# Patient Record
Sex: Female | Born: 1990
Health system: Southern US, Community
[De-identification: ages and names within clinical notes are randomized; demographics above are authoritative.]

## PROBLEM LIST (undated history)

## (undated) DIAGNOSIS — R87619 Unspecified abnormal cytological findings in specimens from cervix uteri: Secondary | ICD-10-CM

## (undated) DIAGNOSIS — F419 Anxiety disorder, unspecified: Secondary | ICD-10-CM

## (undated) DIAGNOSIS — L709 Acne, unspecified: Secondary | ICD-10-CM

## (undated) DIAGNOSIS — R011 Cardiac murmur, unspecified: Secondary | ICD-10-CM

## (undated) HISTORY — PX: WISDOM TOOTH EXTRACTION: SHX21

## (undated) HISTORY — DX: Unspecified abnormal cytological findings in specimens from cervix uteri: R87.619

## (undated) HISTORY — DX: Acne, unspecified: L70.9

---
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
## 2012-04-22 IMAGING — RF DG BARIUM SWALLOW
1 series · 15 of 17 positions shown · non-contrast
Comparison: none

REASON FOR EXAM: W TABLET  esphogeal spasm
COMMENTS:

[Series 1: run · 11 acquisitions, 15 frames shown]
[im 1/11]
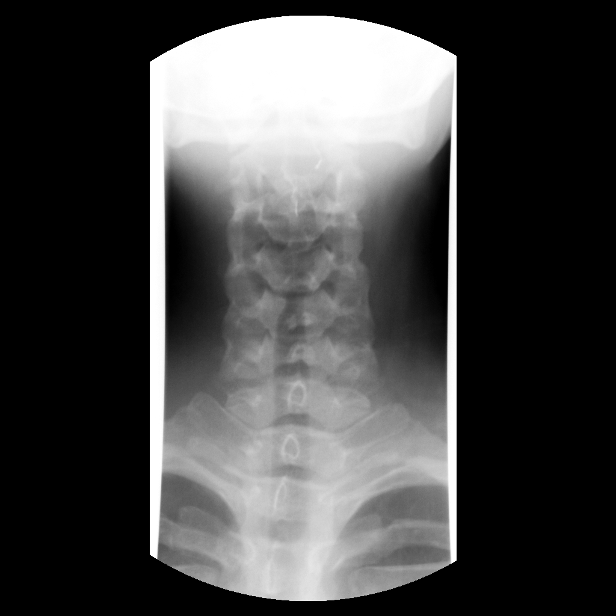
[im 1/11]
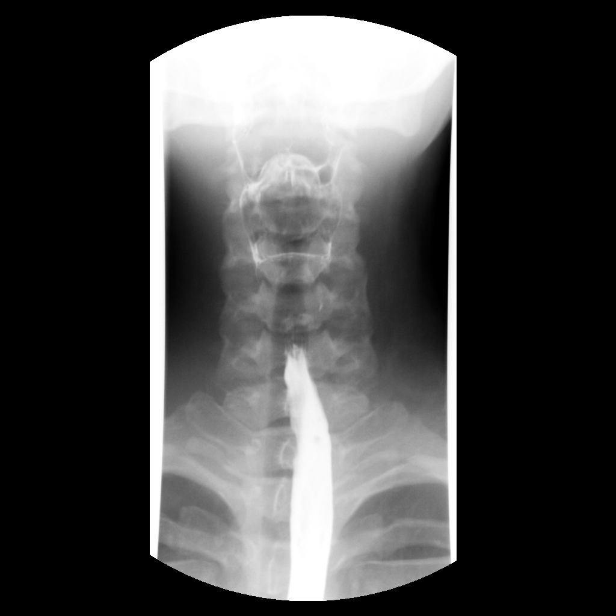
[im 1/11]
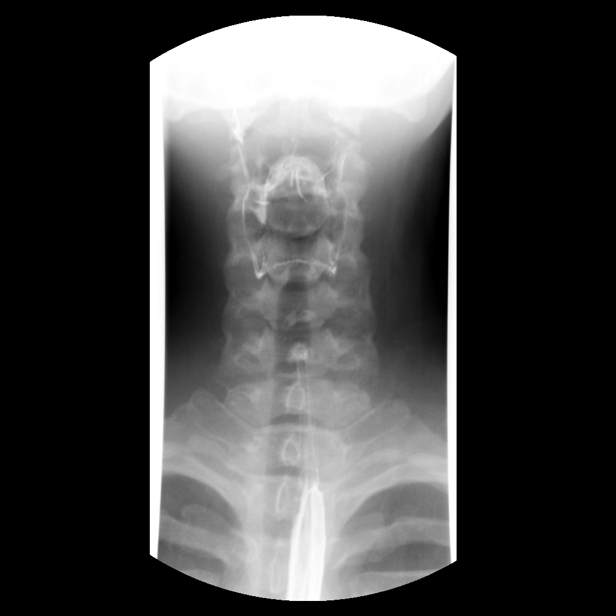
[im 1/11]
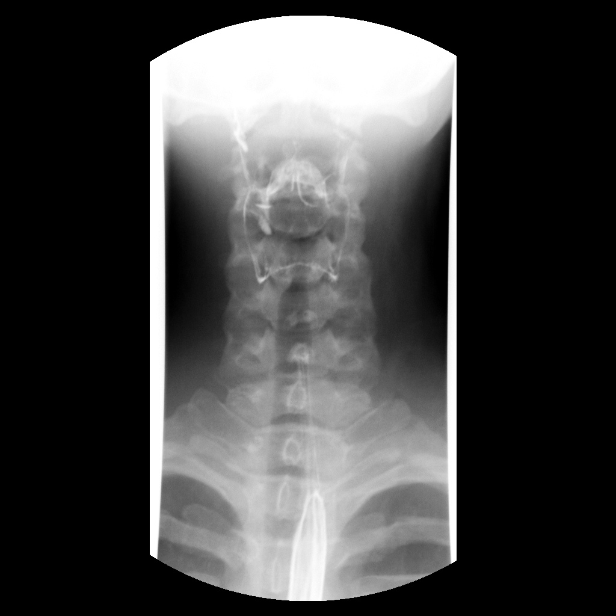
[im 2/11]
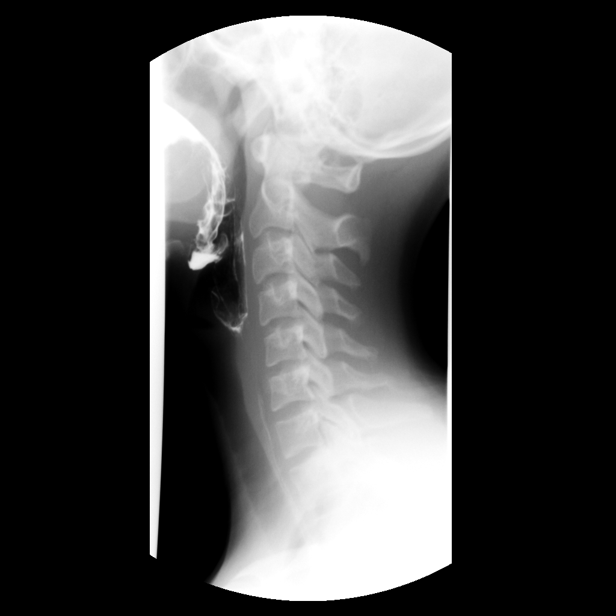
[im 2/11]
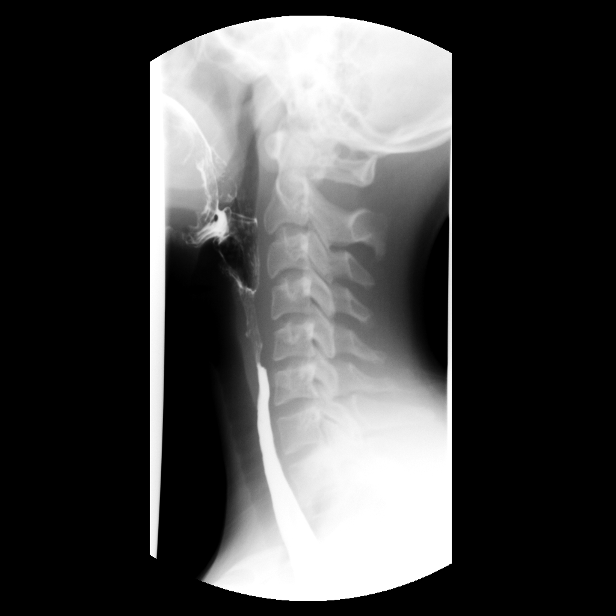
[im 2/11]
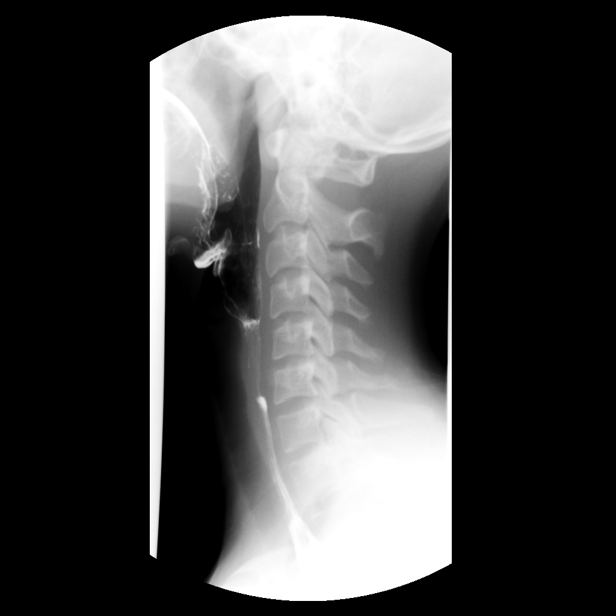
[im 3/11]
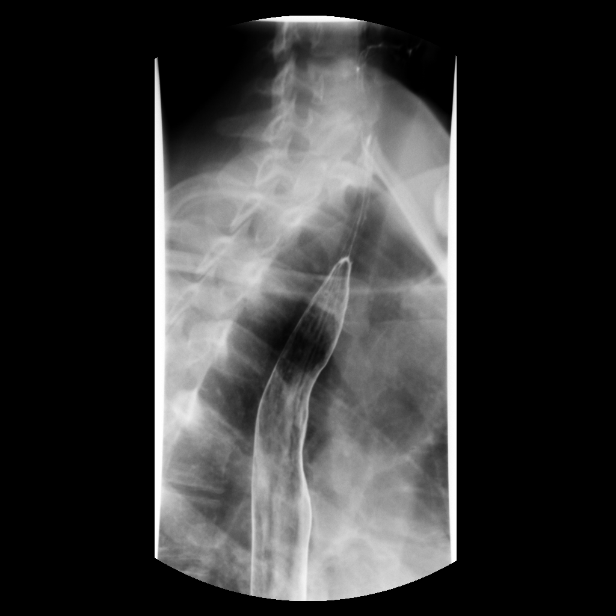
[im 4/11]
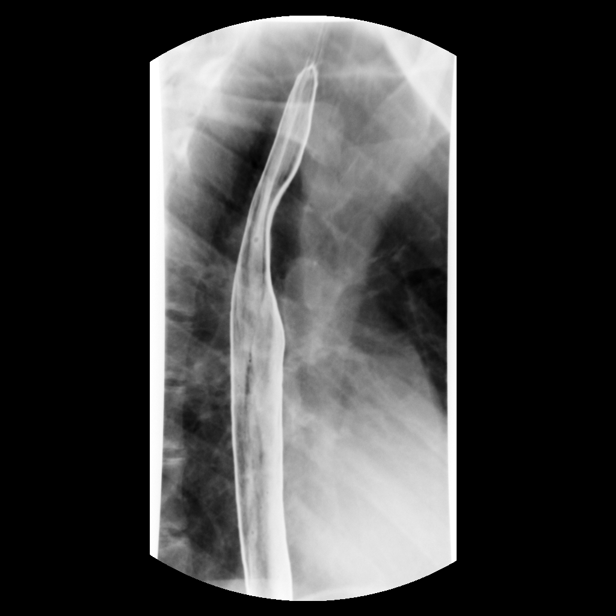
[im 5/11]
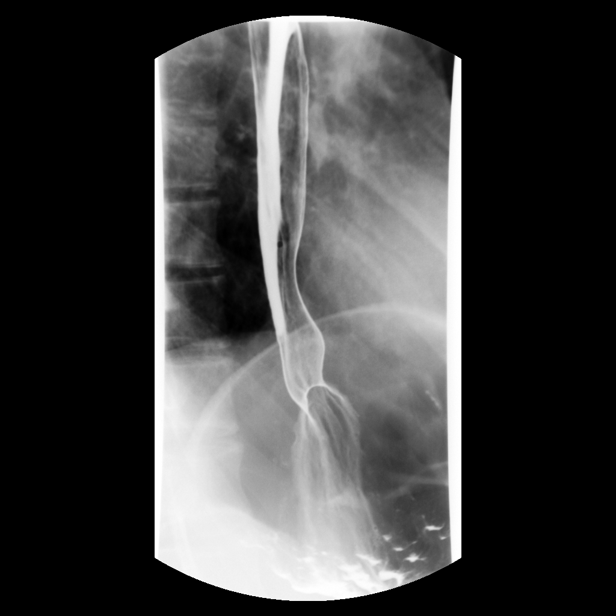
[im 6/11]
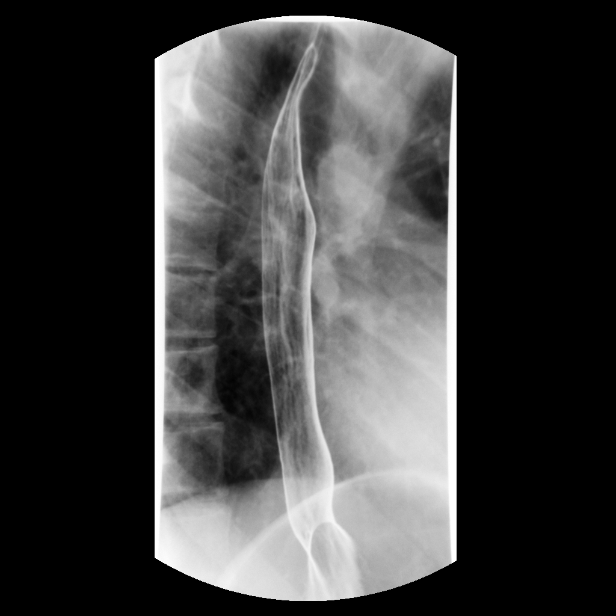
[im 8/11]
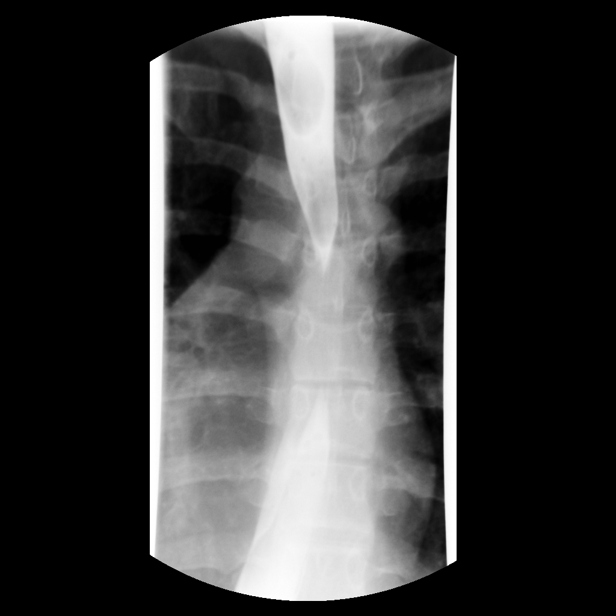
[im 9/11]
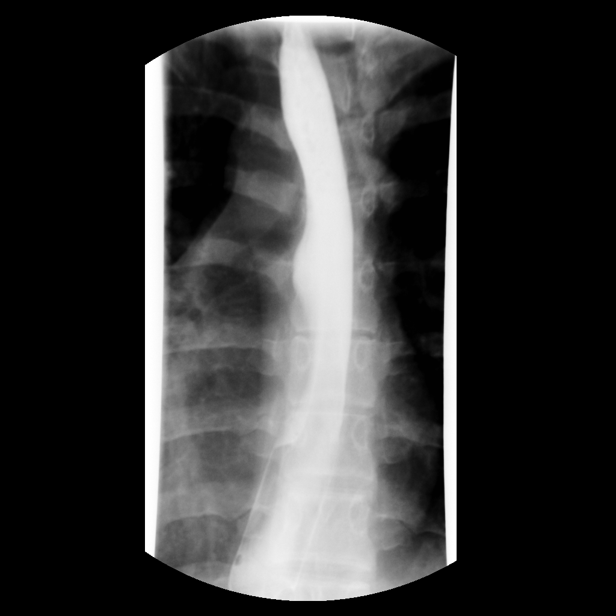
[im 10/11]
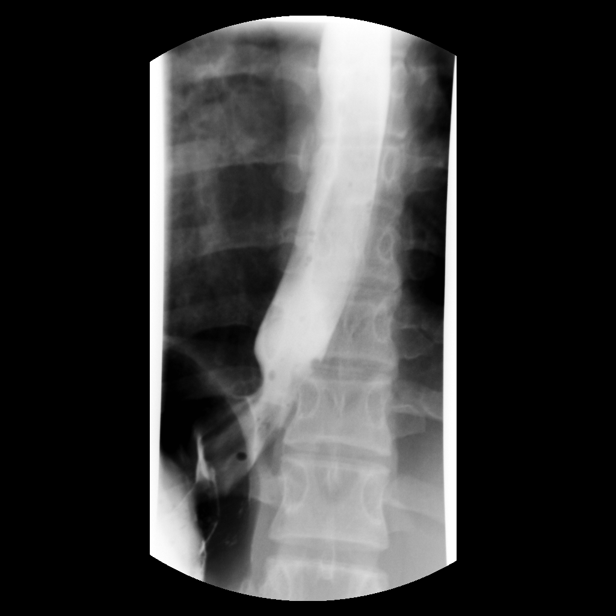
[im 11/11]
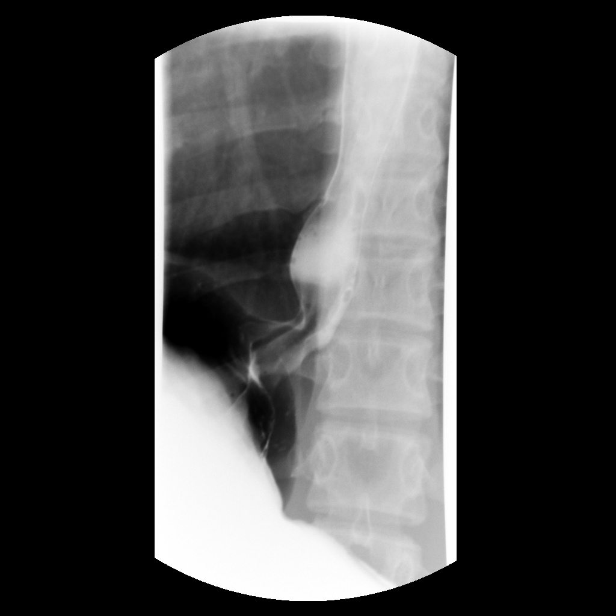

[15 of 17 positions shown; findings below may reference images not displayed]

PROCEDURE:     FL  - FL BARIUM SWALLOW  - May 14, 2010 [DATE]

RESULT:     Dynamic imaging of the cervical esophagus was performed during
swallowing and administration of oral barium. This was followed by
administration of effervescent crystals and evaluation of the remaining
components the esophagus.
FINDINGS: The cervical esophagus is unremarkable. The thoracic esophagus is
unremarkable. There is appropriate relaxation of the lower esophageal
sphincter. There is no evidence of gastroesophageal reflux, esophageal
dysmotility, or a hiatal hernia. A 12.5 mm Barotab was administered which
traversed the esophagus without complications.
IMPRESSION: Unremarkable barium swallow as described above.

## 2015-05-29 DIAGNOSIS — J329 Chronic sinusitis, unspecified: Secondary | ICD-10-CM | POA: Insufficient documentation

## 2015-12-18 ENCOUNTER — Encounter: Payer: Self-pay | Admitting: *Deleted

## 2015-12-20 ENCOUNTER — Ambulatory Visit: Payer: BLUE CROSS/BLUE SHIELD | Admitting: Anesthesiology

## 2015-12-20 ENCOUNTER — Encounter: Payer: Self-pay | Admitting: Otolaryngology

## 2015-12-20 ENCOUNTER — Ambulatory Visit
Admission: RE | Admit: 2015-12-20 | Discharge: 2015-12-20 | Disposition: A | Discharge status: Home / Self Care | Payer: BLUE CROSS/BLUE SHIELD | Source: Ambulatory Visit | Attending: Otolaryngology | Admitting: Otolaryngology

## 2015-12-20 ENCOUNTER — Encounter
Admission: RE | Disposition: A | Discharge status: Home / Self Care | Payer: Self-pay | Source: Ambulatory Visit | Attending: Otolaryngology

## 2015-12-20 DIAGNOSIS — Z809 Family history of malignant neoplasm, unspecified: Secondary | ICD-10-CM | POA: Diagnosis not present

## 2015-12-20 DIAGNOSIS — Z8489 Family history of other specified conditions: Secondary | ICD-10-CM | POA: Insufficient documentation

## 2015-12-20 DIAGNOSIS — Z833 Family history of diabetes mellitus: Secondary | ICD-10-CM | POA: Diagnosis not present

## 2015-12-20 DIAGNOSIS — Z87891 Personal history of nicotine dependence: Secondary | ICD-10-CM | POA: Insufficient documentation

## 2015-12-20 DIAGNOSIS — J342 Deviated nasal septum: Secondary | ICD-10-CM | POA: Diagnosis present

## 2015-12-20 DIAGNOSIS — Z79899 Other long term (current) drug therapy: Secondary | ICD-10-CM | POA: Insufficient documentation

## 2015-12-20 DIAGNOSIS — J329 Chronic sinusitis, unspecified: Secondary | ICD-10-CM | POA: Insufficient documentation

## 2015-12-20 HISTORY — PX: FRONTAL SINUS EXPLORATION: SHX6591

## 2015-12-20 HISTORY — DX: Cardiac murmur, unspecified: R01.1

## 2015-12-20 HISTORY — PX: MAXILLARY ANTROSTOMY: SHX2003

## 2015-12-20 HISTORY — PX: SEPTOPLASTY: SHX2393

## 2015-12-20 HISTORY — PX: ETHMOIDECTOMY: SHX5197

## 2015-12-20 HISTORY — DX: Anxiety disorder, unspecified: F41.9

## 2015-12-20 HISTORY — PX: IMAGE GUIDED SINUS SURGERY: SHX6570

## 2015-12-20 SURGERY — SINUS SURGERY, WITH IMAGING GUIDANCE
Anesthesia: General | Site: Nose | Wound class: Clean Contaminated

## 2015-12-20 MED ORDER — LIDOCAINE-EPINEPHRINE 1 %-1:100000 IJ SOLN
INTRAMUSCULAR | Status: DC | PRN
  Administered 2015-12-20: 7 mL

## 2015-12-20 MED ORDER — FENTANYL CITRATE (PF) 100 MCG/2ML IJ SOLN
INTRAMUSCULAR | Status: DC | PRN
  Administered 2015-12-20: 100 ug via INTRAVENOUS

## 2015-12-20 MED ORDER — ROCURONIUM BROMIDE 100 MG/10ML IV SOLN
INTRAVENOUS | Status: DC | PRN
Start: 2015-12-20 — End: 2015-12-20
  Administered 2015-12-20: 30 mg via INTRAVENOUS
  Administered 2015-12-20: 10 mg via INTRAVENOUS

## 2015-12-20 MED ORDER — SCOPOLAMINE 1 MG/3DAYS TD PT72
1.0000 | MEDICATED_PATCH | Freq: Once | TRANSDERMAL | Status: DC
  Administered 2015-12-20: 1.5 mg via TRANSDERMAL

## 2015-12-20 MED ORDER — ONDANSETRON HCL 4 MG/2ML IJ SOLN: 4.0000 mg | Freq: Once | INTRAMUSCULAR | Status: DC | PRN

## 2015-12-20 MED ORDER — ACETAMINOPHEN 10 MG/ML IV SOLN
1000.0000 mg | Freq: Once | INTRAVENOUS | Status: AC
  Administered 2015-12-20: 1000 mg via INTRAVENOUS

## 2015-12-20 MED ORDER — OXYMETAZOLINE HCL 0.05 % NA SOLN
2.0000 | Freq: Once | NASAL | Status: AC
  Administered 2015-12-20: 2 via NASAL

## 2015-12-20 MED ORDER — PHENYLEPHRINE HCL 0.5 % NA SOLN
NASAL | Status: DC | PRN
  Administered 2015-12-20: 30 mL via TOPICAL

## 2015-12-20 MED ORDER — ONDANSETRON HCL 4 MG/2ML IJ SOLN
INTRAMUSCULAR | Status: DC | PRN
  Administered 2015-12-20: 4 mg via INTRAVENOUS

## 2015-12-20 MED ORDER — OXYCODONE HCL 5 MG PO TABS
5.0000 mg | ORAL_TABLET | Freq: Once | ORAL | Status: AC
  Administered 2015-12-20: 5 mg via ORAL

## 2015-12-20 MED ORDER — LIDOCAINE HCL (CARDIAC) 20 MG/ML IV SOLN
INTRAVENOUS | Status: DC | PRN
  Administered 2015-12-20: 30 mg via INTRAVENOUS

## 2015-12-20 MED ORDER — GLYCOPYRROLATE 0.2 MG/ML IJ SOLN
INTRAMUSCULAR | Status: DC | PRN
  Administered 2015-12-20: 0.2 mg via INTRAVENOUS

## 2015-12-20 MED ORDER — FENTANYL CITRATE (PF) 100 MCG/2ML IJ SOLN
25.0000 ug | INTRAMUSCULAR | Status: DC | PRN
  Administered 2015-12-20: 50 ug via INTRAVENOUS

## 2015-12-20 MED ORDER — CEFAZOLIN SODIUM-DEXTROSE 2-4 GM/100ML-% IV SOLN
2.0000 g | Freq: Once | INTRAVENOUS | Status: AC
  Administered 2015-12-20: 2 g via INTRAVENOUS

## 2015-12-20 MED ORDER — LACTATED RINGERS IV SOLN
INTRAVENOUS | Status: DC
  Administered 2015-12-20: 12:00:00 via INTRAVENOUS

## 2015-12-20 MED ORDER — DEXAMETHASONE SODIUM PHOSPHATE 4 MG/ML IJ SOLN
INTRAMUSCULAR | Status: DC | PRN
  Administered 2015-12-20: 10 mg via INTRAVENOUS

## 2015-12-20 MED ORDER — PROPOFOL 10 MG/ML IV BOLUS
INTRAVENOUS | Status: DC | PRN
  Administered 2015-12-20: 150 mg via INTRAVENOUS

## 2015-12-20 MED ORDER — MIDAZOLAM HCL 5 MG/5ML IJ SOLN
INTRAMUSCULAR | Status: DC | PRN
Start: 2015-12-20 — End: 2015-12-20
  Administered 2015-12-20: 2 mg via INTRAVENOUS

## 2015-12-20 SURGICAL SUPPLY — 45 items
BALLOON SINUPLASTY SYSTEM (BALLOONS) IMPLANT
BATTERY INSTRU NAVIGATION (MISCELLANEOUS) ×12 IMPLANT
BLADE SURG 15 STRL LF DISP TIS (BLADE) IMPLANT
BLADE SURG 15 STRL SS (BLADE)
CANISTER SUCT 1200ML W/VALVE (MISCELLANEOUS) ×3 IMPLANT
CATH IV 18X1 1/4 SAFELET (CATHETERS) ×3 IMPLANT
COAG SUCT 10F 3.5MM HAND CTRL (MISCELLANEOUS) ×3 IMPLANT
COAGULATOR SUCT 8FR VV (MISCELLANEOUS) ×3 IMPLANT
DEVICE INFLATION SEID (MISCELLANEOUS) IMPLANT
DRAPE HEAD BAR (DRAPES) ×3 IMPLANT
DRESSING NASL FOAM PST OP SINU (MISCELLANEOUS) IMPLANT
DRSG NASAL FOAM POST OP SINU (MISCELLANEOUS)
GLOVE PI ULTRA LF STRL 7.5 (GLOVE) ×4 IMPLANT
GLOVE PI ULTRA NON LATEX 7.5 (GLOVE) ×2
IRRIGATOR 4MM STR (IRRIGATION / IRRIGATOR) IMPLANT
IV CATH 18X1 1/4 SAFELET (CATHETERS) ×2
IV NS 500ML (IV SOLUTION) ×1
IV NS 500ML BAXH (IV SOLUTION) ×2 IMPLANT
KIT ROOM TURNOVER OR (KITS) ×3 IMPLANT
NAVIGATION MASK REG  ST (MISCELLANEOUS) ×3 IMPLANT
NEEDLE HYPO 25GX1X1/2 BEV (NEEDLE) ×3 IMPLANT
NEEDLE SPNL 25GX3.5 QUINCKE BL (NEEDLE) ×3 IMPLANT
NS IRRIG 500ML POUR BTL (IV SOLUTION) ×3 IMPLANT
PACK DRAPE NASAL/ENT (PACKS) ×3 IMPLANT
PACKING NASAL EPIS 4X2.4 XEROG (MISCELLANEOUS) ×6 IMPLANT
PAD GROUND ADULT SPLIT (MISCELLANEOUS) ×3 IMPLANT
PATTIES SURGICAL .5 X3 (DISPOSABLE) ×3 IMPLANT
SET HANDPIECE IRR DIEGO (MISCELLANEOUS) IMPLANT
SHAVER DIEGO BLD STD TYPE A (BLADE) ×3 IMPLANT
SOL ANTI-FOG 6CC FOG-OUT (MISCELLANEOUS) ×2 IMPLANT
SOL FOG-OUT ANTI-FOG 6CC (MISCELLANEOUS) ×1
SPLINT NASAL SEPTAL BLV .50 ST (MISCELLANEOUS) ×3 IMPLANT
STRAP BODY AND KNEE 60X3 (MISCELLANEOUS) ×3 IMPLANT
SUT CHROMIC 3-0 (SUTURE) ×1
SUT CHROMIC 3-0 KS 27XMFL CR (SUTURE) ×2
SUT ETHILON 3-0 KS 30 BLK (SUTURE) ×3 IMPLANT
SUT ETHILON 4-0 (SUTURE)
SUT ETHILON 4-0 FS2 18XMFL BLK (SUTURE)
SUT PLAIN GUT 4-0 (SUTURE) ×3 IMPLANT
SUTURE CHRMC 3-0 KS 27XMFL CR (SUTURE) ×2 IMPLANT
SUTURE ETHLN 4-0 FS2 18XMF BLK (SUTURE) IMPLANT
SYR 3ML LL SCALE MARK (SYRINGE) ×3 IMPLANT
TOWEL OR 17X26 4PK STRL BLUE (TOWEL DISPOSABLE) ×3 IMPLANT
TUBING DECLOG (TUBING) ×6 IMPLANT
WATER STERILE IRR 500ML POUR (IV SOLUTION) ×3 IMPLANT

## 2015-12-20 NOTE — Transfer of Care (Signed)
Immediate Anesthesia Transfer of Care Note  Patient: Danielle Schwartz  Procedure(s) Performed: Procedure(s) with comments: IMAGE GUIDED SINUS SURGERY (N/A) - GAVE DISK TO CECE 6/28 DEE SEPTOPLASTY (N/A) ENDOSCOPIC MAXILLARY ANTROSTOMY BILATERAL (Bilateral)  BILATERAL (Bilateral) ENDOSCPIC ETHMOIDECTOMY TOTAL BILATERAL (Bilateral)  Patient Location: PACU  Anesthesia Type: General ETT  Level of Consciousness: awake, alert  and patient cooperative  Airway and Oxygen Therapy: Patient Spontanous Breathing and Patient connected to supplemental oxygen  Post-op Assessment: Post-op Vital signs reviewed, Patient's Cardiovascular Status Stable, Respiratory Function Stable, Patent Airway and No signs of Nausea or vomiting  Post-op Vital Signs: Reviewed and stable  Complications: No apparent anesthesia complications

## 2015-12-20 NOTE — Anesthesia Procedure Notes (Signed)
Procedure Name: Intubation Date/Time: 12/20/2015 1:04 PM Performed by: Andee PolesBUSH, Corley Kohls Pre-anesthesia Checklist: Patient identified, Emergency Drugs available, Suction available, Patient being monitored and Timeout performed Patient Re-evaluated:Patient Re-evaluated prior to inductionOxygen Delivery Method: Circle system utilized Preoxygenation: Pre-oxygenation with 100% oxygen Intubation Type: IV induction Ventilation: Mask ventilation without difficulty Laryngoscope Size: Mac and 3 Grade View: Grade I Tube type: Oral Rae Tube size: 7.0 mm Number of attempts: 1 Placement Confirmation: ETT inserted through vocal cords under direct vision,  positive ETCO2 and breath sounds checked- equal and bilateral Tube secured with: Tape Dental Injury: Teeth and Oropharynx as per pre-operative assessment

## 2015-12-20 NOTE — Discharge Instructions (Signed)
[facility] °[facility] °ENDOSCOPIC SINUS SURGERY °[facility] ° °What is Functional Endoscopic Sinus Surgery? ° The Surgery involves making the natural openings of the sinuses larger by removing the bony partitions that separate the sinuses from the nasal cavity.  The natural sinus lining is preserved as much as possible to allow the sinuses to resume normal function after the surgery.  In some patients nasal polyps (excessively swollen lining of the sinuses) may be removed to relieve obstruction of the sinus openings.  The surgery is performed through the nose using lighted scopes, which eliminates the need for incisions on the face.  A septoplasty is a different procedure which is sometimes performed with sinus surgery.  It involves straightening the boy partition that separates the two sides of your nose.  A crooked or deviated septum may need repair if is obstructing the sinuses or nasal airflow.  Turbinate reduction is also often performed during sinus surgery.  The turbinates are bony proturberances from the side walls of the nose which swell and can obstruct the nose in patients with sinus and allergy problems.  Their size can be surgically reduced to help relieve nasal obstruction. ° °What Can Sinus Surgery Do For Me? ° Sinus surgery can reduce the frequency of sinus infections requiring antibiotic treatment.  This can provide improvement in nasal congestion, post-nasal drainage, facial pressure and nasal obstruction.  Surgery will NOT prevent you from ever having an infection again, so it usually only for patients who get infections 4 or more times yearly requiring antibiotics, or for infections that do not clear with antibiotics.  It will not cure nasal allergies, so patients with allergies may still require medication to treat their allergies after surgery. Surgery may improve headaches related to sinusitis, however, some people will continue to  require medication to control sinus headaches related to allergies.  Surgery will do nothing for other forms of headache (migraine, tension or cluster). ° °What Are the Risks of Endoscopic Sinus Surgery? ° Current techniques allow surgery to be performed safely with little risk, however, there are rare complications that patients should be aware of.  Because the sinuses are located around the eyes, there is risk of eye injury, including blindness, though again, this would be quite rare. This is usually a result of bleeding behind the eye during surgery, which puts the vision oat risk, though there are treatments to protect the vision and prevent permanent disrupted by surgery causing a leak of the spinal fluid that surrounds the brain.  More serious complications would include bleeding inside the brain cavity or damage to the brain.  Again, all of these complications are uncommon, and spinal fluid leaks can be safely managed surgically if they occur.  The most common complication of sinus surgery is bleeding from the nose, which may require packing or cauterization of the nose.  Continued sinus have polyps may experience recurrence of the polyps requiring revision surgery.  Alterations of sense of smell or injury to the tear ducts are also rare complications.  ° °What is the Surgery Like, and what is the Recovery? ° The Surgery usually takes a couple of hours to perform, and is usually performed under a general anesthetic (completely asleep).  Patients are usually discharged home after a couple of hours.  Sometimes during surgery it is necessary to pack the nose to control bleeding, and the packing is left in place for 24 - 48 hours, and removed by your surgeon.    If a septoplasty was performed during the procedure, there is often a splint placed which must be removed after 5-7 days.   °Discomfort: Pain is usually mild to moderate, and can be controlled by prescription pain medication or acetaminophen (Tylenol).   Aspirin, Ibuprofen (Advil, Motrin), or Naprosyn (Aleve) should be avoided, as they can cause increased bleeding.  Most patients feel sinus pressure like they have a bad head cold for several days.  Sleeping with your head elevated can help reduce swelling and facial pressure, as can ice packs over the face.  A humidifier may be helpful to keep the mucous and blood from drying in the nose.  ° °Diet: There are no specific diet restrictions, however, you should generally start with clear liquids and a light diet of bland foods because the anesthetic can cause some nausea.  Advance your diet depending on how your stomach feels.  Taking your pain medication with food will often help reduce stomach upset which pain medications can cause. ° °Nasal Saline Irrigation: It is important to remove blood clots and dried mucous from the nose as it is healing.  This is done by having you irrigate the nose at least 3 - 4 times daily with a salt water solution.  We recommend using NeilMed Sinus Rinse (available at the drug store).  Fill the squeeze bottle with the solution, bend over a sink, and insert the tip of the squeeze bottle into the nose ½ of an inch.  Point the tip of the squeeze bottle towards the inside corner of the eye on the same side your irrigating.  Squeeze the bottle and gently irrigate the nose.  If you bend forward as you do this, most of the fluid will flow back out of the nose, instead of down your throat.   The solution should be warm, near body temperature, when you irrigate.   Each time you irrigate, you should use a full squeeze bottle.  ° °Note that if you are instructed to use Nasal Steroid Sprays at any time after your surgery, irrigate with saline BEFORE using the steroid spray, so you do not wash it all out of the nose. °Another product, Nasal Saline Gel (such as AYR Nasal Saline Gel) can be applied in each nostril 3 - 4 times daily to moisture the nose and reduce scabbing or crusting. ° °Bleeding:   Bloody drainage from the nose can be expected for several days, and patients are instructed to irrigate their nose frequently with salt water to help remove mucous and blood clots.  The drainage may be dark red or brown, though some fresh blood may be seen intermittently, especially after irrigation.  Do not blow you nose, as bleeding may occur. If you must sneeze, keep your mouth open to allow air to escape through your mouth. ° °If heavy bleeding occurs: Irrigate the nose with saline to rinse out clots, then spray the nose 3 - 4 times with Afrin Nasal Decongestant Spray.  The spray will constrict the blood vessels to slow bleeding.  Pinch the lower half of your nose shut to apply pressure, and lay down with your head elevated.  Ice packs over the nose may help as well. If bleeding persists despite these measures, you should notify your doctor.  Do not use the Afrin routinely to control nasal congestion after surgery, as it can result in worsening congestion and may affect healing.  ° ° ° °Activity: Return to work varies among patients. Most patients will be   out of work at least 5 - 7 days to recover.  Patient may return to work after they are off of narcotic pain medication, and feeling well enough to perform the functions of their job.  Patients must avoid heavy lifting (over 10 pounds) or strenuous physical for 2 weeks after surgery, so your employer may need to assign you to light duty, or keep you out of work longer if light duty is not possible.  NOTE: you should not drive, operate dangerous machinery, do any mentally demanding tasks or make any important legal or financial decisions while on narcotic pain medication and recovering from the general anesthetic.    Call Your Doctor Immediately if You Have Any of the Following: 1. Bleeding that you cannot control with the above measures 2. Loss of vision, double vision, bulging of the eye or black eyes. 3. Fever over 101 degrees 4. Neck stiffness with  severe headache, fever, nausea and change in mental state. You are always encourage to call anytime with concerns, however, please call with requests for pain medication refills during office hours.  Office Endoscopy: During follow-up visits your doctor will remove any packing or splints that may have been placed and evaluate and clean your sinuses endoscopically.  Topical anesthetic will be used to make this as comfortable as possible, though you may want to take your pain medication prior to the visit.  How often this will need to be done varies from patient to patient.  After complete recovery from the surgery, you may need follow-up endoscopy from time to time, particularly if there is concern of recurrent infection or nasal polyps.    General Anesthesia, Adult, Care After Refer to this sheet in the next few weeks. These instructions provide you with information on caring for yourself after your procedure. Your health care provider may also give you more specific instructions. Your treatment has been planned according to current medical practices, but problems sometimes occur. Call your health care provider if you have any problems or questions after your procedure. WHAT TO EXPECT AFTER THE PROCEDURE After the procedure, it is typical to experience:  Sleepiness.  Nausea and vomiting. HOME CARE INSTRUCTIONS  For the first 24 hours after general anesthesia:  Have a responsible person with you.  Do not drive a car. If you are alone, do not take public transportation.  Do not drink alcohol.  Do not take medicine that has not been prescribed by your health care provider.  Do not sign important papers or make important decisions.  You may resume a normal diet and activities as directed by your health care provider.  Change bandages (dressings) as directed.  If you have questions or problems that seem related to general anesthesia, call the hospital and ask for the anesthetist or  anesthesiologist on call. SEEK MEDICAL CARE IF:  You have nausea and vomiting that continue the day after anesthesia.  You develop a rash. SEEK IMMEDIATE MEDICAL CARE IF:   You have difficulty breathing.  You have chest pain.  You have any allergic problems.   This information is not intended to replace advice given to you by your health care provider. Make sure you discuss any questions you have with your health care provider.   Document Released: 09/15/2000 Document Revised: 06/30/2014 Document Reviewed: 10/08/2011 Elsevier Interactive Patient Education 2016 [employer].  Scopolamine skin patches REMOVE PATCH IN 72 HOURS AND WASH HANDS What is this medicine? SCOPOLAMINE (skoe POL a meen) is used to prevent nausea  and vomiting caused by motion sickness, anesthesia and surgery. This medicine may be used for other purposes; ask your health care provider or pharmacist if you have questions. What should I tell my health care provider before I take this medicine? They need to know if you have any of these conditions: -glaucoma -kidney or liver disease -an unusual or allergic reaction (especially skin allergy) to scopolamine, atropine, other medicines, foods, dyes, or preservatives -pregnant or trying to get pregnant -breast-feeding How should I use this medicine? This medicine is for external use only. Follow the directions on the prescription label. One patch contains enough medicine to prevent motion sickness for up to 3 days. Apply the patch at least 4 hours before you need it and only wear one disc at a time. Choose an area behind the ear, that is clean, dry, hairless and free from any cuts or irritation. Wipe the area with a clean dry tissue. Peel off the plastic backing of the skin patch, trying not to touch the adhesive side with your hands. Do not cut the patches. Firmly apply to the area you have chosen, with the metallic side of the patch to the skin and the tan-colored side  showing. Once firmly in place, wash your hands well with soap and water. Remove the disc after 3 days, or sooner if you no longer need it. After removing the patch, wash your hands and the area behind your ear thoroughly with soap and water. The patch will still contain some medicine after use. To avoid accidental contact or ingestion by children or pets, fold the used patch in half with the sticky side together and throw away in the trash out of the reach of children and pets. If you need to use a second patch after you remove the first, place it behind the other ear. Talk to your pediatrician regarding the use of this medicine in children. Special care may be needed. Overdosage: If you think you have taken too much of this medicine contact a poison control center or emergency room at once. NOTE: This medicine is only for you. Do not share this medicine with others. What if I miss a dose? Make sure you apply the patch at least 4 hours before you need it. You can apply it the night before traveling. What may interact with this medicine? -benztropine -bethanechol -medicines for anxiety or sleeping problems like diazepam or temazepam -medicines for hay fever and other allergies -medicines for mental depression -muscle relaxants This list may not describe all possible interactions. Give your health care provider a list of all the medicines, herbs, non-prescription drugs, or dietary supplements you use. Also tell them if you smoke, drink alcohol, or use illegal drugs. Some items may interact with your medicine. What should I watch for while using this medicine? Keep the patch dry, if possible, to prevent it from falling off. Limited contact with water, however, as in bathing or swimming, will not affect the system. If the patch falls off, throw it away and put a new one behind the other ear. You may get drowsy or dizzy. Do not drive, use machinery, or do anything that needs mental alertness until you  know how this medicine affects you. Do not stand or sit up quickly, especially if you are an older patient. This reduces the risk of dizzy or fainting spells. Alcohol may interfere with the effect of this medicine. Avoid alcoholic drinks. Your mouth may get dry. Chewing sugarless gum or sucking hard candy,  and drinking plenty of water may help. Contact your doctor if the problem does not go away or is severe. This medicine may cause dry eyes and blurred vision. If you wear contact lenses you may feel some discomfort. Lubricating drops may help. See your eye doctor if the problem does not go away or is severe. If you are going to have a magnetic resonance imaging (MRI) procedure, tell your MRI technician if you have this patch on your body. It must be removed before a MRI. What side effects may I notice from receiving this medicine? Side effects that you should report to your doctor or health care professional as soon as possible: -agitation, nervousness, confusion -blurred vision and other eye problems -dizziness, drowsiness -eye pain or redness in the whites of the eye -hallucinations -pain or difficulty passing urine -skin rash, itching -vomiting Side effects that usually do not require medical attention (report to your doctor or health care professional if they continue or are bothersome): -headache -nausea This list may not describe all possible side effects. Call your doctor for medical advice about side effects. You may report side effects to FDA at 1-800-FDA-1088. Where should I keep my medicine? Keep out of the reach of children. Store at room temperature between 20 and 25 degrees C (68 and 77 degrees F). Throw away any unused medicine after the expiration date. When you remove a patch, fold it and throw it in the trash as described above. NOTE: This sheet is a summary. It may not cover all possible information. If you have questions about this medicine, talk to your doctor, pharmacist,  or health care provider.    2016, Elsevier/Gold Standard. (2011-11-06 13:31:48)

## 2015-12-20 NOTE — Op Note (Signed)
12/20/2015  3:37 PM    Melvia HeapsSmith, Momina  [id number]   Pre-Op Dx:  Septal deviation, chronic bilateral frontal sinusitis, chronic bilateral maxillary sinusitis, chronic bilateral ethmoid sinusitis  Post-op Dx: Same  Proc: Septoplasty, bilateral endoscopic total ethmoidectomy, bilateral endoscopic maxillary antrostomies, bilateral endoscopic frontal sinusotomies, use of image guided system, outfracture of right inferior turbinate   Surg:  Shaquna Geigle H  Anes:  GOT  EBL:  100 mL  Comp:  None  Findings:  Thickened mucous membranes at the openings maxillary sinuses and frontal sinuses. There was clear thick mucus coming from the left maxillary sinus. The ethmoid plate was deviated to the right and there is a bony spur to the left posteriorly  Procedure: The patient was given general anesthesia by oral endotracheal intubation. The nose prepped using 6 mL 1% Xylocaine with epi 100,000 for infiltration nasal septum and lateral nasal walls. Cottonoid pledgets were placed in the airway on both sides soaked with phenylephrine and Xylocaine. The image guided system was brought in and the CT scan was downloaded to the disc. The template was applied the face in the template was registered. Recheck 0.5 mm of variance. The suction instruments were then registered and there was perfect alignment between the suction instruments and the sinuses on the CT scan. She was prepped and draped sterile fashion.  The cotton pledges were removed and the 0 scope was used to visualize the nose. See the septum had an S-shaped deformity with the ethmoid plate to the right and the vomer to the left. A left Killian incision was created with elevation of mucoperichondrium the left side of the quadrangular plate. The bony cartilaginous junction was split and the mucoperiosteum  was elevated on both sides of the vomer and ethmoid plate. This was fractured remove to get the large spur out of the vomer on the left side and to  remove some of the ethmoid plate that was buckled to the right. Remaining anterior cartilaginous plate was freed up inferiorly. A 2-3 mm bridge of cartilage was removed inferiorly to allow it to swing back to the midline. The mucosal flaps were placed back in their anatomic position and this straightened out septum with a good airway on both sides. 40 plain gut suture on a mini Keith needle was used for a through and through suture to hold the mucosal flaps to the septum and to close the [place] incision.  The 0 scope was used to visualize the left nasal passage. The middle turbinate was infractured. 1 mL of local anesthesia was placed in the uncinate process and in the middle turbinate for vasoconstriction. A side biter was used to incise the uncinate process and then this was removed with 45 through biting forceps and the microdebrider. The natural ostium was visualized underneath this and was widened. There is some thick clear mucus that was coming from this sinus. Natural ostium was opened posteriorly to widen the maxillary antrum. Re-scopes were used to visualize the sinus make sure was clear. The 0 scope was then used for opening up the posterior ethmoid air cells using the microdebrider. The image guided system was used to visualize the depth of dissection make sure all the ethmoid air cells were opened. The middle ethmoid air cells were opened as well 0 scope and then the 30 scope was used for opening the anterior ethmoid air cells. I can see the opening into the frontal sinus duct and this was followed using the microdebrider and the frontal sinus through biting  instruments to widen the opening that he could see a 6 mm opening into the frontal sinus. The frontal sinus image guided suction was used to visualize this area and verify the sinus was well opened. A cottonoid pledget soaked in phenylephrine and Xylocaine was placed here temporarily for vasoconstriction.  The right side was visualized with  the 0 scope and the middle turbinate was infractured. The septum was straighter now and this crated a larger middle meatus. Junction was made in the uncinate process and middle turbinate again with local anesthesia for vasoconstriction. The uncinate process was incised and removed using the side biters, 45 through biting forceps, and the microdebrider. The maxillary antrum was widened and opened posteriorly. A good opening into the maxillary sinus. 30 scopes were used to visualize the area make sure it was cleaned and the natural ostium was included in this. The 0 scope was used to open up the posterior ethmoid air cells and the image guided system was used to make sure all the air cells were opened. The middle ethmoid air cells were then opened as well make sure there all cleaned and open. The 30 scope was then used to visualize the anterior ethmoid air cells and these were opened using the microdebrider and 45 forceps. The frontal sinus duct was evident had a lot of thick bone around it. The frontal sinus through biting forceps were used to widen the opening and created about a 6 mm hole opened into the frontal sinus duct. The frontal sinus image guided suction was used to verify were in the frontal sinus this area and it was completely clear. A cottonoid pledget was placed in the side once all the sinuses were opened.  The left side was visualized again and all the sinuses were clear. There is minimal ooze of any blood. This was suctioned clear and xerogel was then placed in the anterior ethmoid and frontal sinus duct and more in the posterior ethmoid as well. The right side was revisualized again and the 0 and 30 scopes and also showed to help all the sinuses cleared minimal ooze. Xerogel was placed in the anterior ethmoid again and then in the posterior ethmoid. The right inferior turbinate was outfractured in a little more room the right nasal airway where the left inferior turbinate was already  lateralized some cousins of the previous septal spur side. Xomed 0.5 mm splints were then trimmed and placed on both sides nasal septum and held in position with a 3-0 nylon through and through suture.  The patient tolerated the procedure well. She was awakened taken to the recovery room in satisfactory condition. There were no operative complications.  Dispo:   To PACU to be discharged home  Plan:  To follow-up in the office in 6 days for splint removal. She will rest at home with her head elevated. We will arts saline flushes tomorrow. She is on Augmentin and will use Norco 5/325 for pain as needed. She was started prednisone taper from 30 mg 0 over the next 6 days. They'll call if she has any problems over the weekend.  Tamanna Whitson H  12/20/2015 3:37 PM

## 2015-12-20 NOTE — Anesthesia Preprocedure Evaluation (Signed)
Anesthesia Evaluation  Patient identified by MRN, date of birth, ID band  Reviewed: Allergy & Precautions, H&P , NPO status , Patient's Chart, lab work & pertinent test results  Airway Mallampati: II  TM Distance: >3 FB Neck ROM: full    Dental no notable dental hx.    Pulmonary former smoker,    Pulmonary exam normal        Cardiovascular  Rhythm:regular Rate:Normal     Neuro/Psych    GI/Hepatic   Endo/Other    Renal/GU      Musculoskeletal   Abdominal   Peds  Hematology   Anesthesia Other Findings   Reproductive/Obstetrics                             Anesthesia Physical Anesthesia Plan  ASA: I  Anesthesia Plan: General ETT   Post-op Pain Management:    Induction: Intravenous  Airway Management Planned: Oral ETT  Additional Equipment:   Intra-op Plan:   Post-operative Plan:   Informed Consent: I have reviewed the patients History and Physical, chart, labs and discussed the procedure including the risks, benefits and alternatives for the proposed anesthesia with the patient or authorized representative who has indicated his/her understanding and acceptance.     Plan Discussed with: CRNA  Anesthesia Plan Comments:         Anesthesia Quick Evaluation  

## 2015-12-20 NOTE — Anesthesia Postprocedure Evaluation (Signed)
Anesthesia Post Note  Patient: Danielle Schwartz  Procedure(s) Performed: Procedure(s) (LRB): IMAGE GUIDED SINUS SURGERY (N/A) SEPTOPLASTY (N/A) ENDOSCOPIC MAXILLARY ANTROSTOMY BILATERAL (Bilateral)  BILATERAL (Bilateral) ENDOSCPIC ETHMOIDECTOMY TOTAL BILATERAL (Bilateral)  Patient location during evaluation: PACU Anesthesia Type: General Level of consciousness: awake and alert and oriented Pain management: satisfactory to patient Vital Signs Assessment: post-procedure vital signs reviewed and stable Respiratory status: spontaneous breathing, nonlabored ventilation and respiratory function stable Cardiovascular status: blood pressure returned to baseline and stable Postop Assessment: Adequate PO intake and No signs of nausea or vomiting Anesthetic complications: no    Cherly BeachStella, Galo Sayed J

## 2015-12-20 NOTE — H&P (Signed)
  H&P has been reviewed and no changes necessary. To be downloaded later. 

## 2015-12-21 ENCOUNTER — Encounter: Payer: Self-pay | Admitting: Otolaryngology

## 2015-12-24 LAB — SURGICAL PATHOLOGY

## 2016-06-23 HISTORY — PX: UPPER GI ENDOSCOPY: SHX6162

## 2016-06-23 HISTORY — PX: COLONOSCOPY: SHX174

## 2016-09-04 ENCOUNTER — Ambulatory Visit (INDEPENDENT_AMBULATORY_CARE_PROVIDER_SITE_OTHER): Payer: 59 | Admitting: Certified Nurse Midwife

## 2016-09-04 ENCOUNTER — Other Ambulatory Visit: Payer: Self-pay

## 2016-09-04 ENCOUNTER — Encounter: Payer: Self-pay | Admitting: Certified Nurse Midwife

## 2016-09-04 ENCOUNTER — Encounter: Payer: Self-pay | Admitting: Obstetrics and Gynecology

## 2016-09-04 VITALS — BP 116/78 | HR 67 | Ht 69.5 in | Wt 155.1 lb

## 2016-09-04 DIAGNOSIS — N390 Urinary tract infection, site not specified: Secondary | ICD-10-CM | POA: Diagnosis not present

## 2016-09-04 DIAGNOSIS — Z113 Encounter for screening for infections with a predominantly sexual mode of transmission: Secondary | ICD-10-CM

## 2016-09-04 DIAGNOSIS — Z01419 Encounter for gynecological examination (general) (routine) without abnormal findings: Secondary | ICD-10-CM

## 2016-09-04 LAB — POCT URINALYSIS DIPSTICK
BILIRUBIN UA: NEGATIVE
GLUCOSE UA: NEGATIVE
Ketones, UA: NEGATIVE
Leukocytes, UA: NEGATIVE
Nitrite, UA: NEGATIVE
Protein, UA: NEGATIVE
RBC UA: NEGATIVE
SPEC GRAV UA: 1.025
Urobilinogen, UA: 0.2
pH, UA: 6

## 2016-09-04 NOTE — Patient Instructions (Addendum)
Preventive Care 18-39 Years, Female Preventive care refers to lifestyle choices and visits with your health care provider that can promote health and wellness. What does preventive care include?  A yearly physical exam. This is also called an annual well check.  Dental exams once or twice a year.  Routine eye exams. Ask your health care provider how often you should have your eyes checked.  Personal lifestyle choices, including:  Daily care of your teeth and gums.  Regular physical activity.  Eating a healthy diet.  Avoiding tobacco and drug use.  Limiting alcohol use.  Practicing safe sex.  Taking vitamin and mineral supplements as recommended by your health care provider. What happens during an annual well check? The services and screenings done by your health care provider during your annual well check will depend on your age, overall health, lifestyle risk factors, and family history of disease. Counseling  Your health care provider may ask you questions about your:  Alcohol use.  Tobacco use.  Drug use.  Emotional well-being.  Home and relationship well-being.  Sexual activity.  Eating habits.  Work and work environment.  Method of birth control.  Menstrual cycle.  Pregnancy history. Screening  You may have the following tests or measurements:  Height, weight, and BMI.  Diabetes screening. This is done by checking your blood sugar (glucose) after you have not eaten for a while (fasting).  Blood pressure.  Lipid and cholesterol levels. These may be checked every 5 years starting at age 20.  Skin check.  Hepatitis C blood test.  Hepatitis B blood test.  Sexually transmitted disease (STD) testing.  BRCA-related cancer screening. This may be done if you have a family history of breast, ovarian, tubal, or peritoneal cancers.  Pelvic exam and Pap test. This may be done every 3 years starting at age 21. Starting at age 30, this may be done every 5  years if you have a Pap test in combination with an HPV test. Discuss your test results, treatment options, and if necessary, the need for more tests with your health care provider. Vaccines  Your health care provider may recommend certain vaccines, such as:  Influenza vaccine. This is recommended every year.  Tetanus, diphtheria, and acellular pertussis (Tdap, Td) vaccine. You may need a Td booster every 10 years.  Varicella vaccine. You may need this if you have not been vaccinated.  HPV vaccine. If you are 26 or younger, you may need three doses over 6 months.  Measles, mumps, and rubella (MMR) vaccine. You may need at least one dose of MMR. You may also need a second dose.  Pneumococcal 13-valent conjugate (PCV13) vaccine. You may need this if you have certain conditions and were not previously vaccinated.  Pneumococcal polysaccharide (PPSV23) vaccine. You may need one or two doses if you smoke cigarettes or if you have certain conditions.  Meningococcal vaccine. One dose is recommended if you are age 19-21 years and a first-year college student living in a residence hall, or if you have one of several medical conditions. You may also need additional booster doses.  Hepatitis A vaccine. You may need this if you have certain conditions or if you travel or work in places where you may be exposed to hepatitis A.  Hepatitis B vaccine. You may need this if you have certain conditions or if you travel or work in places where you may be exposed to hepatitis B.  Haemophilus influenzae type b (Hib) vaccine. You may need this   if you have certain risk factors. Talk to your health care provider about which screenings and vaccines you need and how often you need them. This information is not intended to replace advice given to you by your health care provider. Make sure you discuss any questions you have with your health care provider. Document Released: 08/05/2001 Document Revised: 02/27/2016  Document Reviewed: 04/10/2015 Elsevier Interactive Patient Education  2017 [employer]  Urinary Tract Infection, Adult A urinary tract infection (UTI) is an infection of any part of the urinary tract. The urinary tract includes the:  Kidneys.  Ureters.  Bladder.  Urethra. These organs make, store, and get rid of pee (urine) in the body. Follow these instructions at home:  Take over-the-counter and prescription medicines only as told by your doctor.  If you were prescribed an antibiotic medicine, take it as told by your doctor. Do not stop taking the antibiotic even if you start to feel better.  Avoid the following drinks:  Alcohol.  Caffeine.  Tea.  Carbonated drinks.  Drink enough fluid to keep your pee clear or pale yellow.  Keep all follow-up visits as told by your doctor. This is important.  Make sure to:  Empty your bladder often and completely. Do not to hold pee for long periods of time.  Empty your bladder before and after sex.  Wipe from front to back after a bowel movement if you are female. Use each tissue one time when you wipe. Contact a doctor if:  You have back pain.  You have a fever.  You feel sick to your stomach (nauseous).  You throw up (vomit).  Your symptoms do not get better after 3 days.  Your symptoms go away and then come back. Get help right away if:  You have very bad back pain.  You have very bad lower belly (abdominal) pain.  You are throwing up and cannot keep down any medicines or water. This information is not intended to replace advice given to you by your health care provider. Make sure you discuss any questions you have with your health care provider. Document Released: 11/26/2007 Document Revised: 11/15/2015 Document Reviewed: 04/30/2015 Elsevier Interactive Patient Education  2017 [employer].

## 2016-09-04 NOTE — Progress Notes (Signed)
ANNUAL PREVENTATIVE CARE GYN  ENCOUNTER NOTE  Subjective:       Danielle Schwartz is a 26 y.o. G0P0000 female here for a routine annual gynecologic exam.  Reports UTIs associated with sexual intercourse and intermittent mild chest discomfort.   She reports drinking cranberry juice and urinating after sexual intercourse, but still having frequent UTIs with a history of greater than four (4) a year.   Spencer's father is a doctor and her mother is a Engineer, civil (consulting). When she told them about her chest discomfort, they advised that it was reflux and encouraged her to take an antiacid. She was taking Prilosec OTC with relief, but couldn't remember to consistently take it.   Denies difficulty breathing or respiratory distress, chest pain, abdominal pain, unexplained vaginal bleeding, and leg pain or swelling.   She works for [employer] in [place]. Her boyfriend resides in [place] and manages the building of [employer].   Gynecologic History  Patient's last menstrual period was 08/11/2016 (exact date).  Contraception: NuvaRing vaginal inserts  Last Pap: 2015. Results were: normal   Obstetric History OB History  Gravida Para Term Preterm AB Living  0 0 0 0 0 0  SAB TAB Ectopic Multiple Live Births  0 0 0 0 0        Past Medical History:  Diagnosis Date  . Anxiety   . Heart murmur     Past Surgical History:  Procedure Laterality Date  . ETHMOIDECTOMY Bilateral 12/20/2015   Procedure: ENDOSCPIC ETHMOIDECTOMY TOTAL BILATERAL;  Surgeon: Vernie Murders, MD;  Location: [facility];  Service: ENT;  Laterality: Bilateral;  . FRONTAL SINUS EXPLORATION Bilateral 12/20/2015   Procedure:  BILATERAL;  Surgeon: Vernie Murders, MD;  Location: [facility];  Service: ENT;  Laterality: Bilateral;  . IMAGE GUIDED SINUS SURGERY N/A 12/20/2015   Procedure: IMAGE GUIDED SINUS SURGERY;  Surgeon: Vernie Murders, MD;  Location: [facility];  Service: ENT;  Laterality: N/A;  GAVE DISK TO CECE  6/28 DEE  . MAXILLARY ANTROSTOMY Bilateral 12/20/2015   Procedure: ENDOSCOPIC MAXILLARY ANTROSTOMY BILATERAL;  Surgeon: Vernie Murders, MD;  Location: [facility];  Service: ENT;  Laterality: Bilateral;  . SEPTOPLASTY N/A 12/20/2015   Procedure: SEPTOPLASTY;  Surgeon: Vernie Murders, MD;  Location: [facility];  Service: ENT;  Laterality: N/A;  . WISDOM TOOTH EXTRACTION      Current Outpatient Prescriptions on File Prior to Visit  Medication Sig Dispense Refill  . vitamin B-12 (CYANOCOBALAMIN) 1000 MCG tablet Take 1,000 mcg by mouth daily.    . fluticasone (FLONASE) 50 MCG/ACT nasal spray Place 2 sprays into both nostrils daily.     No current facility-administered medications on file prior to visit.     No Known Allergies  Social History   Social History  . Marital status: Single    Spouse name: N/A  . Number of children: N/A  . Years of education: N/A   Occupational History  . Not on file.   Social History Main Topics  . Smoking status: Former Smoker    Quit date: 07/20/2015  . Smokeless tobacco: Never Used  . Alcohol use 3.6 oz/week    6 Glasses of wine per week  . Drug use: No  . Sexual activity: Yes    Partners: Male    Birth control/ protection: Condom   Other Topics Concern  . Not on file   Social History Narrative  . No narrative on file    Family History  Problem Relation Age of Onset  . Hyperlipidemia Mother   . Migraines Mother   . Migraines Sister   . Hyperlipidemia Brother   . Hypertension Brother   . Cancer Maternal Grandfather     colon cancer  . Diabetes Maternal Grandfather   . Cancer Paternal Grandmother     skin cancer  . Cancer Paternal Grandfather     bladder cancer/colon cancer    The following portions of the patient's history were reviewed and updated as appropriate: allergies, current medications, past family history, past medical history, past social history, past surgical history and problem list.  Review of  Systems  ROS negative except as noted above. Information obtained from patient.    Objective:   BP 116/78   Pulse 67   Ht 5' 9.5" (1.765 m)   Wt 155 lb 1.6 oz (70.4 kg)   LMP 08/11/2016 (Exact Date)   BMI 22.58 kg/m    CONSTITUTIONAL: Well-developed, well-nourished female in no acute distress.   PSYCHIATRIC: Normal mood and affect. Normal behavior. Normal judgment and thought content.  NEUROLGIC: Alert and oriented to person, place, and time. Normal muscle tone coordination. No cranial nerve deficit noted.  HENT:  Normocephalic, atraumatic, External right and left ear normal.   EYES: Conjunctivae and EOM are normal. Pupils are equal, round, and reactive to light.  NECK: Normal range of motion, supple, no masses.  Normal thyroid.   SKIN: Skin is warm and dry. No rash noted. Not diaphoretic. No erythema. No pallor.  CARDIOVASCULAR: Normal heart rate noted, regular rhythm, no murmur.  RESPIRATORY: Clear to auscultation bilaterally. Effort and breath sounds normal, no problems with respiration noted.  BREASTS: Symmetric in size. No masses, skin changes, nipple drainage, or lymphadenopathy.  ABDOMEN: Soft, normal bowel sounds, no distention noted.  No tenderness, rebound or guarding.   PELVIC:  External Genitalia: Normal  Vagina: Normal  Cervix: Normal  Uterus: Normal  Adnexa: Normal  MUSCULOSKELETAL: Normal range of motion. No tenderness.  No cyanosis, clubbing, or edema.  2+ distal pulses.  LYMPHATIC: No Axillary, Supraclavicular, or Inguinal Adenopathy.  Assessment:   Annual gynecologic examination 26 y.o.   Contraception: condoms   Normal BMI   Problem List Items Addressed This Visit    None    Visit Diagnoses    Well woman exam with routine gynecological exam    -  Primary      Plan:   Pap: NuSwab collected and Pap, Reflex if ASCUS  Labs: Lipid 1 and Vit D Level""CBC, CMP, see orders  Routine preventative health maintenance measures emphasized:  Alcohol/Substance use risks, Stress Management, Peer Pressure Issues and Safe Sex   Encourage patient to sign up for MyChart as a form of communication  Advised home UTI prevention measures and tracking for reflux symptoms. Contact office for urine dip or reflux medication as needed  Return to Clinic - 1 Year or sooner if needed   Gunnar BullaJenkins Michelle Liller Yohn, CNM

## 2016-09-05 LAB — CYTOLOGY - PAP

## 2016-09-08 LAB — NUSWAB VAGINITIS PLUS (VG+)
CANDIDA ALBICANS, NAA: NEGATIVE
CANDIDA GLABRATA, NAA: NEGATIVE
Chlamydia trachomatis, NAA: NEGATIVE
NEISSERIA GONORRHOEAE, NAA: NEGATIVE
Trich vag by NAA: NEGATIVE

## 2016-09-09 LAB — VITAMIN D 1,25 DIHYDROXY
Vitamin D 1, 25 (OH)2 Total: 68 pg/mL
Vitamin D2 1, 25 (OH)2: <10 pg/mL
Vitamin D3 1, 25 (OH)2: 68 pg/mL

## 2016-09-09 LAB — LIPID PANEL
Chol/HDL Ratio: 3.2 ratio units (ref 0.0–4.4)
Cholesterol, Total: 171 mg/dL (ref 100–199)
HDL: 53 mg/dL (ref >39)
LDL Calculated: 104 mg/dL — ABNORMAL HIGH (ref 0–99)
Triglycerides: 71 mg/dL (ref 0–149)
VLDL CHOLESTEROL CAL: 14 mg/dL (ref 5–40)

## 2016-09-09 LAB — COMPREHENSIVE METABOLIC PANEL
ALT: 11 IU/L (ref 0–32)
AST: 23 IU/L (ref 0–40)
Albumin/Globulin Ratio: 1.8 (ref 1.2–2.2)
Albumin: 4.7 g/dL (ref 3.5–5.5)
Alkaline Phosphatase: 60 IU/L (ref 39–117)
BILIRUBIN TOTAL: 0.7 mg/dL (ref 0.0–1.2)
BUN/Creatinine Ratio: 15 (ref 9–23)
BUN: 13 mg/dL (ref 6–20)
CHLORIDE: 105 mmol/L (ref 96–106)
CO2: 16 mmol/L — AB (ref 18–29)
Calcium: 9.6 mg/dL (ref 8.7–10.2)
Creatinine, Ser: 0.86 mg/dL (ref 0.57–1.00)
GFR, EST AFRICAN AMERICAN: 109 mL/min/{1.73_m2} (ref >59)
GFR, EST NON AFRICAN AMERICAN: 95 mL/min/{1.73_m2} (ref >59)
GLUCOSE: 68 mg/dL (ref 65–99)
Globulin, Total: 2.6 g/dL (ref 1.5–4.5)
Potassium: 4.8 mmol/L (ref 3.5–5.2)
Sodium: 142 mmol/L (ref 134–144)
TOTAL PROTEIN: 7.3 g/dL (ref 6.0–8.5)

## 2016-09-09 LAB — CBC
HEMATOCRIT: 37.8 % (ref 34.0–46.6)
HEMOGLOBIN: 12 g/dL (ref 11.1–15.9)
MCH: 30.9 pg (ref 26.6–33.0)
MCHC: 31.7 g/dL (ref 31.5–35.7)
MCV: 97 fL (ref 79–97)
PLATELETS: 329 10*3/uL (ref 150–379)
RBC: 3.88 x10E6/uL (ref 3.77–5.28)
RDW: 13 % (ref 12.3–15.4)
WBC: 5.2 10*3/uL (ref 3.4–10.8)

## 2016-09-22 ENCOUNTER — Encounter: Payer: Self-pay | Admitting: Certified Nurse Midwife

## 2016-09-29 ENCOUNTER — Ambulatory Visit (INDEPENDENT_AMBULATORY_CARE_PROVIDER_SITE_OTHER): Payer: 59 | Admitting: Certified Nurse Midwife

## 2016-09-29 ENCOUNTER — Encounter: Payer: Self-pay | Admitting: Certified Nurse Midwife

## 2016-09-29 VITALS — BP 113/79 | HR 93 | Ht 70.0 in | Wt 154.0 lb

## 2016-09-29 DIAGNOSIS — Z30015 Encounter for initial prescription of vaginal ring hormonal contraceptive: Secondary | ICD-10-CM | POA: Diagnosis not present

## 2016-09-29 DIAGNOSIS — J019 Acute sinusitis, unspecified: Secondary | ICD-10-CM

## 2016-09-29 DIAGNOSIS — K625 Hemorrhage of anus and rectum: Secondary | ICD-10-CM

## 2016-09-29 MED ORDER — CEFDINIR 300 MG PO CAPS
300.0000 mg | ORAL_CAPSULE | Freq: Two times a day (BID) | ORAL | 0 refills | Status: DC
Start: 2016-09-29 — End: 2016-10-13

## 2016-09-29 MED ORDER — HYDROCOD POLST-CPM POLST ER 10-8 MG/5ML PO SUER: 5.0000 mL | Freq: Every evening | ORAL | 0 refills | Status: DC | PRN

## 2016-09-29 MED ORDER — ETONOGESTREL-ETHINYL ESTRADIOL 0.12-0.015 MG/24HR VA RING: VAGINAL_RING | VAGINAL | 3 refills | Status: DC

## 2016-09-29 NOTE — Patient Instructions (Addendum)
Pharyngitis Pharyngitis is a sore throat (pharynx). There is redness, pain, and swelling of your throat. Follow these instructions at home:  Drink enough fluids to keep your pee (urine) clear or pale yellow.  Only take medicine as told by your doctor.  You may get sick again if you do not take medicine as told. Finish your medicines, even if you start to feel better.  Do not take aspirin.  Rest.  Rinse your mouth (gargle) with salt water ( tsp of salt per 1 qt of water) every 1-2 hours. This will help the pain.  If you are not at risk for choking, you can suck on hard candy or sore throat lozenges. Contact a doctor if:  You have large, tender lumps on your neck.  You have a rash.  You cough up green, yellow-brown, or bloody spit. Get help right away if:  You have a stiff neck.  You drool or cannot swallow liquids.  You throw up (vomit) or are not able to keep medicine or liquids down.  You have very bad pain that does not go away with medicine.  You have problems breathing (not from a stuffy nose). This information is not intended to replace advice given to you by your health care provider. Make sure you discuss any questions you have with your health care provider. Document Released: 11/26/2007 Document Revised: 11/15/2015 Document Reviewed: 02/14/2013 Elsevier Interactive Patient Education  2017 Elsevier Inc. Sinusitis, Adult Sinusitis is soreness and inflammation of your sinuses. Sinuses are hollow spaces in the bones around your face. They are located:  Around your eyes.  In the middle of your forehead.  Behind your nose.  In your cheekbones. Your sinuses and nasal passages are lined with a stringy fluid (mucus). Mucus normally drains out of your sinuses. When your nasal tissues get inflamed or swollen, the mucus can get trapped or blocked so air cannot flow through your sinuses. This lets bacteria, viruses, and funguses grow, and that leads to infection. Follow  these instructions at home: Medicines   Take, use, or apply over-the-counter and prescription medicines only as told by your doctor. These may include nasal sprays.  If you were prescribed an antibiotic medicine, take it as told by your doctor. Do not stop taking the antibiotic even if you start to feel better. Hydrate and Humidify   Drink enough water to keep your pee (urine) clear or pale yellow.  Use a cool mist humidifier to keep the humidity level in your home above 50%.  Breathe in steam for 10-15 minutes, 3-4 times a day or as told by your doctor. You can do this in the bathroom while a hot shower is running.  Try not to spend time in cool or dry air. Rest   Rest as much as possible.  Sleep with your head raised (elevated).  Make sure to get enough sleep each night. General instructions   Put a warm, moist washcloth on your face 3-4 times a day or as told by your doctor. This will help with discomfort.  Wash your hands often with soap and water. If there is no soap and water, use hand sanitizer.  Do not smoke. Avoid being around people who are smoking (secondhand smoke).  Keep all follow-up visits as told by your doctor. This is important. Contact a doctor if:  You have a fever.  Your symptoms get worse.  Your symptoms do not get better within 10 days. Get help right away if:  You have  a very bad headache.  You cannot stop throwing up (vomiting).  You have pain or swelling around your face or eyes.  You have trouble seeing.  You feel confused.  Your neck is stiff.  You have trouble breathing. This information is not intended to replace advice given to you by your health care provider. Make sure you discuss any questions you have with your health care provider. Document Released: 11/26/2007 Document Revised: 02/03/2016 Document Reviewed: 04/04/2015 Elsevier Interactive Patient Education  2017 [employer]. About Hemorrhoids  Hemorrhoids are swollen  veins in the lower rectum and anus.  Also called piles, hemorrhoids are a common problem.  Hemorrhoids may be internal (inside the rectum) or external (around the anus).  Internal Hemorrhoids  Internal hemorrhoids are often painless, but they rarely cause bleeding.  The internal veins may stretch and fall down (prolapse) through the anus to the outside of the body.  The veins may then become irritated and painful.  External Hemorrhoids  External hemorrhoids can be easily seen or felt around the anal opening.  They are under the skin around the anus.  When the swollen veins are scratched or broken by straining, rubbing or wiping they sometimes bleed.  How Hemorrhoids Occur  Veins in the rectum and around the anus tend to swell under pressure.  Hemorrhoids can result from increased pressure in the veins of your anus or rectum.  Some sources of pressure are:   Straining to have a bowel movement because of constipation  Waiting too long to have a bowel movement  Coughing and sneezing often  Sitting for extended periods of time, including on the toilet  Diarrhea  Obesity  Trauma or injury to the anus  Some liver diseases  Stress  Family history of hemorrhoids  Pregnancy  Pregnant women should try to avoid becoming constipated, because they are more likely to have hemorrhoids during pregnancy.  In the last trimester of pregnancy, the enlarged uterus may press on blood vessels and causes hemorrhoids.  In addition, the strain of childbirth sometimes causes hemorrhoids after the birth.  Symptoms of Hemorrhoids  Some symptoms of hemorrhoids include:  Swelling and/or a tender lump around the anus  Itching, mild burning and bleeding around the anus  Painful bowel movements with or without constipation  Bright red blood covering the stool, on toilet paper or in the toilet bowel.   Symptoms usually go away within a few days.  Always talk to your doctor about any bleeding to make  sure it is not from some other causes.  Diagnosing and Treating Hemorrhoids  Diagnosis is made by an examination by your healthcare provider.  Special test can be performed by your doctor.    Most cases of hemorrhoids can be treated with:  High-fiber diet: Eat more high-fiber foods, which help prevent constipation.  Ask for more detailed fiber information on types and sources of fiber from your healthcare provider.  Fluids: Drink plenty of water.  This helps soften bowel movements so they are easier to pass.  Sitz baths and cold packs: Sitting in lukewarm water two or three times a day for 15 minutes cleases the anal area and may relieve discomfort.  If the water is too hot, swelling around the anus will get worse.  Placing a cloth-covered ice pack on the anus for ten minutes four times a day can also help reduce selling.  Gently pushing a prolapsed hemorrhoid back inside after the bath or ice pack can be helpful.  Medications:  For mild discomfort, your healthcare provider may suggest over-the-counter pain medication or prescribe a cream or ointment for topical use.  The cream may contain witch hazel, zinc oxide or petroleum jelly.  Medicated suppositories are also a treatment option.  Always consult your doctor before applying medications or creams.  Procedures and surgeries: There are also a number of procedures and surgeries to shrink or remove hemorrhoids in more serious cases.  Talk to your physician about these options.  You can often prevent hemorrhoids or keep them from becoming worse by maintaining a healthy lifestyle.  Eat a fiber-rich diet of fruits, vegetables and whole grains.  Also, drink plenty of water and exercise regularly.   2007, [employer] Doc.30 Cefdinir capsules What is this medicine? CEFDINIR (SEF di ner) is a cephalosporin antibiotic. It is used to treat certain kinds of bacterial infections. It will not work for colds, flu, or other viral  infections. This medicine may be used for other purposes; ask your health care provider or pharmacist if you have questions. COMMON BRAND NAME(S): Omnicef What should I tell my health care provider before I take this medicine? They need to know if you have any of these conditions: -bleeding problems -kidney disease -stomach or intestine problems (especially colitis) -an unusual or allergic reaction to cefdinir, other cephalosporin antibiotics, penicillin, penicillamine, other foods, dyes or preservatives -pregnant or trying to get pregnant -breast-feeding How should I use this medicine? Take this medicine by mouth. Swallow it with a drink of water. Follow the directions on the prescription label. You can take it with or without food. If it upsets your stomach it may help to take it with food. Take your doses at regular intervals. Do not take it more often than directed. Finish all the medicine you are prescribed even if you think your infection is better. Talk to your pediatrician regarding the use of this medicine in children. Special care may be needed. Overdosage: If you think you have taken too much of this medicine contact a poison control center or emergency room at once. NOTE: This medicine is only for you. Do not share this medicine with others. What if I miss a dose? If you miss a dose, take it as soon as you can. If it is almost time for your next dose, take only that dose. Do not take double or extra doses. What may interact with this medicine? -antacids that contain aluminum or magnesium -iron supplements -other antibiotics -probenecid This list may not describe all possible interactions. Give your health care provider a list of all the medicines, herbs, non-prescription drugs, or dietary supplements you use. Also tell them if you smoke, drink alcohol, or use illegal drugs. Some items may interact with your medicine. What should I watch for while using this medicine? Tell your  doctor or health care professional if your symptoms do not get better in a few days. If you are diabetic you may get a false-positive result for sugar in your urine. Check with your doctor or health care professional before you change your diet or the dose of your diabetes medicine. What side effects may I notice from receiving this medicine? Side effects that you should report to your doctor or health care professional as soon as possible: -allergic reactions like skin rash, itching or hives, swelling of the face, lips, or tongue -bloody or watery diarrhea -breathing problems -fever -redness, blistering, peeling or loosening of the skin, including inside the mouth -seizures -trouble passing urine or change in  the amount of urine -unusual bleeding or bruising -unusually weak or tired Side effects that usually do not require medical attention (report to your doctor or health care professional if they continue or are bothersome): -constipation -diarrhea -dizziness -dry mouth -headache -loss of appetite -nausea, vomiting -stomach pain -stool discoloration -tiredness -vaginal discharge, itching, or odor in women This list may not describe all possible side effects. Call your doctor for medical advice about side effects. You may report side effects to FDA at 1-800-FDA-1088. Where should I keep my medicine? Keep out of the reach of children. Store at room temperature between 15 and 30 degrees C (59 and 86 degrees F). Throw the medicine away after the expiration date. NOTE: This sheet is a summary. It may not cover all possible information. If you have questions about this medicine, talk to your doctor, pharmacist, or health care provider.  2018 Elsevier/Gold Standard (2015-10-15 15:52:44)

## 2016-09-29 NOTE — Progress Notes (Signed)
GYN ENCOUNTER NOTE  Subjective:       Danielle Schwartz is a 26 y.o. G0P0000 female here for evaluation of rectal bleeding and cold symptoms.   Celita reports cold symptoms including green nasal drainage, intermittent non productive cough, and ear pain x nine (9) days.   She reports no relief with home measures including Claritin, benadryl, and mucinex.   She reports rectal bleeding from Wednesday to Sunday of last week. Blood was present in the toilet water and on the toilet paper. Bleeding episodes were associated with both hard and loose bowel movements.   Family history is significant for colon CA in maternal grandfather and paternal grandmother.  Denies difficulty breathing or respiratory distress, fever, chest pain, abdominal pain, unexplained vaginal bleeding, current rectal bleeding, dysuria, and leg pain or swelling.   Gynecologic History  Patient's last menstrual period was 09/08/2016 (exact date).   Contraception: NuvaRing vaginal inserts   Last Pap: 09/04/2016. Results were: abnormal.   Scheduled for colposcopy with Dr. Greggory Keen on 10/13/2016.  Obstetric History OB History  Gravida Para Term Preterm AB Living  0 0 0 0 0 0  SAB TAB Ectopic Multiple Live Births  0 0 0 0 0        Past Medical History:  Diagnosis Date  . Acne   . Anxiety   . Heart murmur     Past Surgical History:  Procedure Laterality Date  . ETHMOIDECTOMY Bilateral 12/20/2015   Procedure: ENDOSCPIC ETHMOIDECTOMY TOTAL BILATERAL;  Surgeon: Vernie Murders, MD;  Location: [facility];  Service: ENT;  Laterality: Bilateral;  . FRONTAL SINUS EXPLORATION Bilateral 12/20/2015   Procedure:  BILATERAL;  Surgeon: Vernie Murders, MD;  Location: [facility];  Service: ENT;  Laterality: Bilateral;  . IMAGE GUIDED SINUS SURGERY N/A 12/20/2015   Procedure: IMAGE GUIDED SINUS SURGERY;  Surgeon: Vernie Murders, MD;  Location: [facility];  Service: ENT;  Laterality: N/A;  GAVE DISK TO CE[facility]  6/28 DEE  . MAXILLARY ANTROSTOMY Bilateral 12/20/2015   Procedure: ENDOSCOPIC MAXILLARY ANTROSTOMY BILATERAL;  Surgeon: Vernie Murders, MD;  Location: [facility];  Service: ENT;  Laterality: Bilateral;  . SEPTOPLASTY N/A 12/20/2015   Procedure: SEPTOPLASTY;  Surgeon: Vernie Murders, MD;  Location: [facility];  Service: ENT;  Laterality: N/A;  . WISDOM TOOTH EXTRACTION      Current Outpatient Prescriptions on File Prior to Visit  Medication Sig Dispense Refill  . Triamcinolone Acetonide (NASACORT ALLERGY 24HR NA) Place into the nose daily.    . vitamin B-12 (CYANOCOBALAMIN) 1000 MCG tablet Take 1,000 mcg by mouth daily.     No current facility-administered medications on file prior to visit.     No Known Allergies  Social History   Social History  . Marital status: Single    Spouse name: N/A  . Number of children: N/A  . Years of education: N/A   Occupational History  . Not on file.   Social History Main Topics  . Smoking status: Former Smoker    Quit date: 07/20/2015  . Smokeless tobacco: Never Used  . Alcohol use 3.6 oz/week    6 Glasses of wine per week     Comment: weekly  . Drug use: No  . Sexual activity: Yes    Partners: Male    Birth control/ protection: Condom   Other Topics Concern  . Not on file   Social History Narrative  . No narrative on file    Family History  Problem Relation  Age of Onset  . Hyperlipidemia Mother   . Migraines Mother   . Migraines Sister   . Hyperlipidemia Brother   . Hypertension Brother   . Cancer Maternal Grandfather     colon cancer  . Diabetes Maternal Grandfather   . Cancer Paternal Grandmother     skin cancer  . Cancer Paternal Grandfather     bladder cancer/colon cancer    The following portions of the patient's history were reviewed and updated as appropriate: allergies, current medications, past family history, past medical history, past social history, past surgical history and problem  list.  Review of Systems  Review of Systems - Negative except as noted above History obtained from the patient  Objective:   BP 113/79   Pulse 93   Ht  (1.778 m)   Wt 154 lb (69.9 kg)   LMP 09/08/2016 (Exact Date)   BMI 22.10 kg/m    GENERAL: Alert and oriented x 4, no apparent distress  HENT: bilaterally tympanic membranes WNL  CARDIAC: Regular rate and rhythm   RESPIRATORY: Lungs clear to auscultation bilaterally  Pelvic exam: RECTAL: external hemorrhoids non-thrombosed, normal sphincter tone.  Assessment:   1. Rectal bleeding  2. Acute sinusitis, recurrence not specified, unspecified location  Plan:   Rx Omnicef and Tussionex, see orders.  Encouraged stool softners, Benefiber, increased water intake, and topical OTC hydrocortisone for hemorrhoid management.   Reviewed red flag symptoms and when to call.   Follow up as previously scheduled.   Gunnar Bulla, CNM

## 2016-10-10 ENCOUNTER — Encounter: Payer: Self-pay | Admitting: Certified Nurse Midwife

## 2016-10-13 ENCOUNTER — Encounter: Payer: Self-pay | Admitting: Obstetrics and Gynecology

## 2016-10-13 ENCOUNTER — Ambulatory Visit (INDEPENDENT_AMBULATORY_CARE_PROVIDER_SITE_OTHER): Payer: 59 | Admitting: Obstetrics and Gynecology

## 2016-10-13 VITALS — BP 107/69 | HR 69 | Ht 70.0 in | Wt 151.9 lb

## 2016-10-13 DIAGNOSIS — R87810 Cervical high risk human papillomavirus (HPV) DNA test positive: Secondary | ICD-10-CM | POA: Diagnosis not present

## 2016-10-13 DIAGNOSIS — Z308 Encounter for other contraceptive management: Secondary | ICD-10-CM | POA: Insufficient documentation

## 2016-10-13 DIAGNOSIS — Z3044 Encounter for surveillance of vaginal ring hormonal contraceptive device: Secondary | ICD-10-CM

## 2016-10-13 DIAGNOSIS — R87610 Atypical squamous cells of undetermined significance on cytologic smear of cervix (ASC-US): Secondary | ICD-10-CM

## 2016-10-13 DIAGNOSIS — N870 Mild cervical dysplasia: Secondary | ICD-10-CM | POA: Insufficient documentation

## 2016-10-13 LAB — POCT URINE PREGNANCY: Preg Test, Ur: NEGATIVE

## 2016-10-13 MED ORDER — ETONOGESTREL-ETHINYL ESTRADIOL 0.12-0.015 MG/24HR VA RING: VAGINAL_RING | VAGINAL | 12 refills | Status: DC

## 2016-10-13 NOTE — Progress Notes (Signed)
GYN ENCOUNTER NOTE  Subjective:       Danielle Schwartz is a 26 y.o. G0P0000 female is here for gynecologic evaluation of the following issues:  1. ASCUS/positive Pap smear.     Nonsmoker History of> 1 partner STD history-negative NuvaRing is used for contraception; has used this in the past and would like to restart her prescription  Gynecologic History Patient's last menstrual period was 10/04/2016 (exact date). Contraception: NuvaRing vaginal inserts Last Pap: ASCUS/positive 09/04/2016 Last mammogram:NA  Obstetric History OB History  Gravida Para Term Preterm AB Living  0 0 0 0 0 0  SAB TAB Ectopic Multiple Live Births  0 0 0 0 0        Past Medical History:  Diagnosis Date  . Abnormal Pap smear of cervix    ascus/pos  . Acne   . Anxiety   . Heart murmur     Past Surgical History:  Procedure Laterality Date  . ETHMOIDECTOMY Bilateral 12/20/2015   Procedure: ENDOSCPIC ETHMOIDECTOMY TOTAL BILATERAL;  Surgeon: Vernie Murders, MD;  Location: [facility];  Service: ENT;  Laterality: Bilateral;  . FRONTAL SINUS EXPLORATION Bilateral 12/20/2015   Procedure:  BILATERAL;  Surgeon: Vernie Murders, MD;  Location: [facility];  Service: ENT;  Laterality: Bilateral;  . IMAGE GUIDED SINUS SURGERY N/A 12/20/2015   Procedure: IMAGE GUIDED SINUS SURGERY;  Surgeon: Vernie Murders, MD;  Location: [facility];  Service: ENT;  Laterality: N/A;  GAVE DISK TO CECE 6/28 DEE  . MAXILLARY ANTROSTOMY Bilateral 12/20/2015   Procedure: ENDOSCOPIC MAXILLARY ANTROSTOMY BILATERAL;  Surgeon: Vernie Murders, MD;  Location: [facility];  Service: ENT;  Laterality: Bilateral;  . SEPTOPLASTY N/A 12/20/2015   Procedure: SEPTOPLASTY;  Surgeon: Vernie Murders, MD;  Location: [facility];  Service: ENT;  Laterality: N/A;  . WISDOM TOOTH EXTRACTION      Current Outpatient Prescriptions on File Prior to Visit  Medication Sig Dispense Refill  . cholecalciferol (VITAMIN D) 1000 units  tablet Take 1,000 Units by mouth daily.    . Probiotic Product (PROBIOTIC-10 PO) Take by mouth.    . Triamcinolone Acetonide (NASACORT ALLERGY 24HR NA) Place into the nose daily.    . vitamin B-12 (CYANOCOBALAMIN) 1000 MCG tablet Take 1,000 mcg by mouth daily.    . Zinc 100 MG TABS Take by mouth.     No current facility-administered medications on file prior to visit.     No Known Allergies  Social History   Social History  . Marital status: Single    Spouse name: N/A  . Number of children: N/A  . Years of education: N/A   Occupational History  . Not on file.   Social History Main Topics  . Smoking status: Former Smoker    Quit date: 07/20/2015  . Smokeless tobacco: Never Used  . Alcohol use 3.6 oz/week    6 Glasses of wine per week     Comment: weekly  . Drug use: No  . Sexual activity: Yes    Partners: Male    Birth control/ protection: Condom   Other Topics Concern  . Not on file   Social History Narrative  . No narrative on file    Family History  Problem Relation Age of Onset  . Hyperlipidemia Mother   . Migraines Mother   . Migraines Sister   . Hyperlipidemia Brother   . Hypertension Brother   . Cancer Maternal Grandfather     colon cancer  . Diabetes Maternal  Grandfather   . Cancer Paternal Grandmother     skin cancer  . Cancer Paternal Grandfather     bladder cancer/colon cancer    The following portions of the patient's history were reviewed and updated as appropriate: allergies, current medications, past family history, past medical history, past social history, past surgical history and problem list.  Review of Systems No acute problems identified  Objective:   BP 107/69   Pulse 69   Ht  (1.778 m)   Wt 151 lb 14.4 oz (68.9 kg)   LMP 10/04/2016 (Exact Date)   BMI 21.80 kg/m  CONSTITUTIONAL: Well-developed, well-nourished female in no acute distress.  HENT:  Normocephalic, atraumatic.  NECK: Not examined SKIN: Skin is warm and  dry. No rash noted. Not diaphoretic. No erythema. No pallor. NEUROLGIC: Alert and oriented to person, place, and time. PSYCHIATRIC: Normal mood and affect. Normal behavior. Normal judgment and thought content. CARDIOVASCULAR:Not Examined RESPIRATORY: Not Examined BREASTS: Not Examined ABDOMEN: Soft, non distended; Non tender.  No Organomegaly. PELVIC:  External Genitalia: Normal  BUS: Normal  Vagina: Normal; no lesions  Cervix: Normal; nulliparous; no gross lesions  Uterus: Not examined  Adnexa: Not examined  RV: Normal external exam   Bladder: Nontender MUSCULOSKELETAL: Normal range of motion. No tenderness.  No cyanosis, clubbing, or edema.  PROCEDURE: Colposcopy of the upper vagina and cervix with biopsy Indication: ASCUS/positive Pap smear Findings: Adequate colposcopy; thin rim of acetowhite epithelium between 12 and 3:00 Biopsies:  Cervix 2:00 Description: Verbal consent is obtained. Pregnancy test is negative. Patient was placed in dorsal lithotomy position. Peterson speculum was placed in the vagina to facilitate visualization of the cervix. The vagina and cervix were painted with acetic acid solution using Fox swabs. Colposcopy is performed of the upper vagina and cervix with identification of a thin rim of acetowhite epithelium between 12:00 and 3:00. The squamocolumnar junction is visualized. Biopsy of the cervix at 2:00 is taken with Tischler forceps. Monsel solution is applied for hemostasis. Blood loss minimal. Complications none.     Assessment:   1. ASCUS with positive high risk HPV cervical - POCT urine pregnancy  2. Encounter for surveillance of vaginal ring hormonal contraceptive device     Plan:   1. Colposcopy of cervix and upper adjacent vagina with biopsy 2. Cervical biopsy 2:00 3. Return in 6 months for repeat colposcopy 4. High-risk HPV was explained 5. Condom use is encouraged 6. Smoking avoidance is strongly encouraged 7. NuvaRing is  prescribed  Herold Harms, MD  Note: This dictation was prepared with Dragon dictation along with smaller phrase technology. Any transcriptional errors that result from this process are unintentional.

## 2016-10-13 NOTE — Patient Instructions (Addendum)
1. Colposcopy with biopsies is done today 2. Return in 6 months for colposcopy 3. Start NuvaRing with next cycle 4. Condom use encouraged 5. Avoid tobacco products   Colposcopy, Care After This sheet gives you information about how to care for yourself after your procedure. Your health care provider may also give you more specific instructions. If you have problems or questions, contact your health care provider. What can I expect after the procedure? If you had a colposcopy without a biopsy, you can expect to feel fine right away, but you may have some spotting for a few days. You can go back to your normal activities. If you had a colposcopy with a biopsy, it is common to have:  Soreness and pain. This may last for a few days.  Light-headedness.  Mild vaginal bleeding or dark-colored, grainy discharge. This may last for a few days. The discharge may be due to a solution that was used during the procedure. You may need to wear a sanitary pad during this time.  Spotting for at least 48 hours after the procedure. Follow these instructions at home:  Take over-the-counter and prescription medicines only as told by your health care provider. Talk with your health care provider about what type of over-the-counter pain medicine and prescription medicine you can start taking again. It is especially important to talk with your health care provider if you take blood-thinning medicine.  Do not drive or use heavy machinery while taking prescription pain medicine.  For at least 3 days after your procedure, or as long as told by your health care provider, avoid:  Douching.  Using tampons.  Having sexual intercourse.  Continue to use birth control (contraception).  Limit your physical activity for the first day after the procedure as told by your health care provider. Ask your health care provider what activities are safe for you.  It is up to you to get the results of your procedure. Ask  your health care provider, or the department performing the procedure, when your results will be ready.  Keep all follow-up visits as told by your health care provider. This is important. Contact a health care provider if:  You develop a skin rash. Get help right away if:  You are bleeding heavily from your vagina or you are passing blood clots. This includes using more than one sanitary pad per hour for 2 hours in a row.  You have a fever or chills.  You have pelvic pain.  You have abnormal, yellow-colored, or bad-smelling vaginal discharge. This could be a sign of infection.  You have severe pain or cramps in your lower abdomen that do not get better with medicine.  You feel light-headed or dizzy, or you faint. Summary  If you had a colposcopy without a biopsy, you can expect to feel fine right away, but you may have some spotting for a few days. You can go back to your normal activities.  If you had a colposcopy with a biopsy, you may notice mild pain and spotting for 48 hours after the procedure.  Avoid douching, using tampons, and having sexual intercourse for 3 days after the procedure or as long as told by your health care provider.  Contact your health care provider if you have bleeding, severe pain, or signs of infection. This information is not intended to replace advice given to you by your health care provider. Make sure you discuss any questions you have with your health care provider. Document Released: 03/30/2013  Document Revised: 01/25/2016 Document Reviewed: 01/25/2016 Elsevier Interactive Patient Education  2017 [employer].

## 2016-10-15 LAB — PATHOLOGY

## 2016-12-31 DIAGNOSIS — Z800 Family history of malignant neoplasm of digestive organs: Secondary | ICD-10-CM | POA: Diagnosis not present

## 2017-01-14 DIAGNOSIS — K625 Hemorrhage of anus and rectum: Secondary | ICD-10-CM | POA: Diagnosis not present

## 2017-01-14 DIAGNOSIS — J342 Deviated nasal septum: Secondary | ICD-10-CM | POA: Insufficient documentation

## 2017-01-14 DIAGNOSIS — R51 Headache: Secondary | ICD-10-CM

## 2017-01-14 DIAGNOSIS — Z8371 Family history of colonic polyps: Secondary | ICD-10-CM | POA: Insufficient documentation

## 2017-01-14 DIAGNOSIS — R519 Headache, unspecified: Secondary | ICD-10-CM | POA: Insufficient documentation

## 2017-02-09 DIAGNOSIS — F4323 Adjustment disorder with mixed anxiety and depressed mood: Secondary | ICD-10-CM | POA: Diagnosis not present

## 2017-02-26 DIAGNOSIS — K648 Other hemorrhoids: Secondary | ICD-10-CM | POA: Diagnosis not present

## 2017-02-26 DIAGNOSIS — K649 Unspecified hemorrhoids: Secondary | ICD-10-CM | POA: Diagnosis not present

## 2017-02-26 DIAGNOSIS — R1013 Epigastric pain: Secondary | ICD-10-CM | POA: Diagnosis not present

## 2017-02-26 DIAGNOSIS — K2960 Other gastritis without bleeding: Secondary | ICD-10-CM | POA: Diagnosis not present

## 2017-02-26 DIAGNOSIS — R195 Other fecal abnormalities: Secondary | ICD-10-CM | POA: Diagnosis not present

## 2017-02-26 DIAGNOSIS — K625 Hemorrhage of anus and rectum: Secondary | ICD-10-CM | POA: Diagnosis not present

## 2017-02-26 DIAGNOSIS — R1012 Left upper quadrant pain: Secondary | ICD-10-CM | POA: Diagnosis not present

## 2017-02-26 DIAGNOSIS — K921 Melena: Secondary | ICD-10-CM | POA: Diagnosis not present

## 2017-03-04 DIAGNOSIS — F4323 Adjustment disorder with mixed anxiety and depressed mood: Secondary | ICD-10-CM | POA: Diagnosis not present

## 2017-03-25 DIAGNOSIS — F4323 Adjustment disorder with mixed anxiety and depressed mood: Secondary | ICD-10-CM | POA: Diagnosis not present

## 2017-04-05 DIAGNOSIS — R81 Glycosuria: Secondary | ICD-10-CM | POA: Diagnosis not present

## 2017-04-05 DIAGNOSIS — N3000 Acute cystitis without hematuria: Secondary | ICD-10-CM | POA: Diagnosis not present

## 2017-04-05 DIAGNOSIS — R309 Painful micturition, unspecified: Secondary | ICD-10-CM | POA: Diagnosis not present

## 2017-04-14 ENCOUNTER — Ambulatory Visit (INDEPENDENT_AMBULATORY_CARE_PROVIDER_SITE_OTHER): Payer: BLUE CROSS/BLUE SHIELD | Admitting: Obstetrics and Gynecology

## 2017-04-14 ENCOUNTER — Encounter: Payer: Self-pay | Admitting: Obstetrics and Gynecology

## 2017-04-14 VITALS — BP 108/70 | HR 67 | Ht 70.0 in | Wt 151.5 lb

## 2017-04-14 DIAGNOSIS — Z3049 Encounter for surveillance of other contraceptives: Secondary | ICD-10-CM

## 2017-04-14 DIAGNOSIS — Z308 Encounter for other contraceptive management: Secondary | ICD-10-CM | POA: Diagnosis not present

## 2017-04-14 DIAGNOSIS — R87810 Cervical high risk human papillomavirus (HPV) DNA test positive: Secondary | ICD-10-CM | POA: Diagnosis not present

## 2017-04-14 DIAGNOSIS — N870 Mild cervical dysplasia: Secondary | ICD-10-CM

## 2017-04-14 DIAGNOSIS — R87610 Atypical squamous cells of undetermined significance on cytologic smear of cervix (ASC-US): Secondary | ICD-10-CM | POA: Diagnosis not present

## 2017-04-14 NOTE — Progress Notes (Signed)
Chief complaint: 1.  History of ASCUS/positive high risk HPV Pap smear 2.  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  At last visit colposcopy did verify CIN-1 of the cervix. Contraceptive method-condoms Tobacco use history-negative  Abnormal Pap smear history: 09/04/2016-Pap smear/HPV ASCUS/positive 10/13/2016-colposcopy: Adequate; thin rim of acetowhite epithelium at 12:00 to 3:00; cervical biopsy 2:00-CIN-1 04/14/2017-Pap smear  Past medical history, past surgical history, problem list, medications, and allergies are reviewed  OBJECTIVE: BP 108/70   Pulse 67   Ht 5\' 10"  (1.778 m)   Wt 151 lb 8 oz (68.7 kg)   LMP 03/24/2017 (Exact Date)   BMI 21.74 kg/m  Pleasant female no acute distress.  Alert and oriented. Pelvic exam: External genitalia-normal BUS-normal Vagina-normal Cervix-no lesions, normal Uterus-not examined Adnexa-not examined  ASSESSMENT: 1.  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  Contraception-condoms  PLAN: 1.  Pap smear 2.  Continue condom use 3.  Return in 6 months for repeat Pap  Herold HarmsMartin A Cassia Fein, MD  Note: This dictation was prepared with Dragon dictation along with smaller phrase technology. Any transcriptional errors that result from this process are unintentional.

## 2017-04-14 NOTE — Patient Instructions (Addendum)
1. Pap smear today. 2. Return in 6 months for repeat pap.

## 2017-04-15 DIAGNOSIS — F4323 Adjustment disorder with mixed anxiety and depressed mood: Secondary | ICD-10-CM | POA: Diagnosis not present

## 2017-04-23 LAB — PAP IG W/ RFLX HPV ASCU: PAP Smear Comment: 0

## 2017-04-23 LAB — HPV DNA PROBE HIGH RISK, AMPLIFIED: HPV, high-risk: POSITIVE — AB

## 2017-05-08 DIAGNOSIS — F4323 Adjustment disorder with mixed anxiety and depressed mood: Secondary | ICD-10-CM | POA: Diagnosis not present

## 2017-05-18 DIAGNOSIS — H5213 Myopia, bilateral: Secondary | ICD-10-CM | POA: Diagnosis not present

## 2017-06-26 DIAGNOSIS — F4323 Adjustment disorder with mixed anxiety and depressed mood: Secondary | ICD-10-CM | POA: Diagnosis not present

## 2017-07-24 DIAGNOSIS — F4323 Adjustment disorder with mixed anxiety and depressed mood: Secondary | ICD-10-CM | POA: Diagnosis not present

## 2017-09-07 ENCOUNTER — Encounter: Payer: Self-pay | Admitting: Certified Nurse Midwife

## 2017-09-23 DIAGNOSIS — B9689 Other specified bacterial agents as the cause of diseases classified elsewhere: Secondary | ICD-10-CM | POA: Diagnosis not present

## 2017-09-23 DIAGNOSIS — J329 Chronic sinusitis, unspecified: Secondary | ICD-10-CM | POA: Diagnosis not present

## 2017-10-13 ENCOUNTER — Encounter: Payer: Self-pay | Admitting: Certified Nurse Midwife

## 2017-10-13 ENCOUNTER — Encounter: Payer: BLUE CROSS/BLUE SHIELD | Admitting: Obstetrics and Gynecology

## 2017-10-19 ENCOUNTER — Ambulatory Visit (INDEPENDENT_AMBULATORY_CARE_PROVIDER_SITE_OTHER): Payer: BLUE CROSS/BLUE SHIELD | Admitting: Certified Nurse Midwife

## 2017-10-19 ENCOUNTER — Encounter: Payer: Self-pay | Admitting: Certified Nurse Midwife

## 2017-10-19 VITALS — BP 133/73 | HR 58 | Ht 70.0 in | Wt 151.1 lb

## 2017-10-19 DIAGNOSIS — N920 Excessive and frequent menstruation with regular cycle: Secondary | ICD-10-CM

## 2017-10-19 DIAGNOSIS — Z124 Encounter for screening for malignant neoplasm of cervix: Secondary | ICD-10-CM | POA: Diagnosis not present

## 2017-10-19 DIAGNOSIS — Z842 Family history of other diseases of the genitourinary system: Secondary | ICD-10-CM

## 2017-10-19 DIAGNOSIS — N898 Other specified noninflammatory disorders of vagina: Secondary | ICD-10-CM | POA: Diagnosis not present

## 2017-10-19 DIAGNOSIS — N6452 Nipple discharge: Secondary | ICD-10-CM

## 2017-10-19 DIAGNOSIS — L689 Hypertrichosis, unspecified: Secondary | ICD-10-CM

## 2017-10-19 DIAGNOSIS — Z01419 Encounter for gynecological examination (general) (routine) without abnormal findings: Secondary | ICD-10-CM | POA: Diagnosis not present

## 2017-10-19 DIAGNOSIS — L709 Acne, unspecified: Secondary | ICD-10-CM

## 2017-10-19 MED ORDER — NITROFURANTOIN MONOHYD MACRO 100 MG PO CAPS: 100.0000 mg | ORAL_CAPSULE | Freq: Every evening | ORAL | 1 refills | Status: DC | PRN

## 2017-10-19 NOTE — Progress Notes (Signed)
ANNUAL PREVENTATIVE CARE GYN  ENCOUNTER NOTE  Subjective:       Danielle Schwartz is a 27 y.o. G0P0000 female here for a routine annual gynecologic exam.  Current complaints: 1. Vaginal discharge since completing antibiotic for sinus infection 2. Milky clear bilateral nipple discharge 3. Shortened menses for the last two (2) months 4. Increased acne and facial hair; sister recently diagnosed with PCOS 5. History of abnormal Pap smear and colposcopy  Denies difficulty breathing or respiratory distress, chest pain, abdominal pain, excessive vaginal bleeding, and leg pain or welling. Endorses vaginal irritation after intercourse. No relief with home treatment measures.   Questions PCOS and use of Aldactone for hair growth and Doxy for acne.    Gynecologic History  Patient's last menstrual period was 09/27/2017. Period Duration (Days): shorten Period Pattern: Regular Menstrual Flow: Light Dysmenorrhea: (!) Moderate Dysmenorrhea Symptoms: Cramping  Contraception: condoms  Last Pap: 03/2017. Results were: ASCUS/HPV+  Obstetric History OB History  Gravida Para Term Preterm AB Living  0 0 0 0 0 0  SAB TAB Ectopic Multiple Live Births  0 0 0 0 0    Past Medical History:  Diagnosis Date  . Abnormal Pap smear of cervix    ascus/pos  . Acne   . Anxiety   . Heart murmur     Past Surgical History:  Procedure Laterality Date  . ETHMOIDECTOMY Bilateral 12/20/2015   Procedure: ENDOSCPIC ETHMOIDECTOMY TOTAL BILATERAL;  Surgeon: Vernie Murders, MD;  Location: [facility];  Service: ENT;  Laterality: Bilateral;  . FRONTAL SINUS EXPLORATION Bilateral 12/20/2015   Procedure:  BILATERAL;  Surgeon: Vernie Murders, MD;  Location: [facility];  Service: ENT;  Laterality: Bilateral;  . IMAGE GUIDED SINUS SURGERY N/A 12/20/2015   Procedure: IMAGE GUIDED SINUS SURGERY;  Surgeon: Vernie Murders, MD;  Location: [facility];  Service: ENT;  Laterality: N/A;  GAVE DISK TO CE[facility] 6/28 DEE   . MAXILLARY ANTROSTOMY Bilateral 12/20/2015   Procedure: ENDOSCOPIC MAXILLARY ANTROSTOMY BILATERAL;  Surgeon: Vernie Murders, MD;  Location: [facility];  Service: ENT;  Laterality: Bilateral;  . SEPTOPLASTY N/A 12/20/2015   Procedure: SEPTOPLASTY;  Surgeon: Vernie Murders, MD;  Location: [facility];  Service: ENT;  Laterality: N/A;  . WISDOM TOOTH EXTRACTION      Current Outpatient Medications on File Prior to Visit  Medication Sig Dispense Refill  . cholecalciferol (VITAMIN D) 1000 units tablet Take 1,000 Units by mouth daily.    . Probiotic Product (PROBIOTIC-10 PO) Take by mouth.    . Triamcinolone Acetonide (NASACORT ALLERGY 24HR NA) Place into the nose daily.    . vitamin B-12 (CYANOCOBALAMIN) 1000 MCG tablet Take 1,000 mcg by mouth daily.    . Zinc 100 MG TABS Take by mouth.     No current facility-administered medications on file prior to visit.     No Known Allergies  Social History   Socioeconomic History  . Marital status: Single    Spouse name: Not on file  . Number of children: Not on file  . Years of education: Not on file  . Highest education level: Not on file  Occupational History  . Not on file  Social Needs  . Financial resource strain: Not on file  . Food insecurity:    Worry: Not on file    Inability: Not on file  . Transportation needs:    Medical: Not on file    Non-medical: Not on file  Tobacco Use  . Smoking status:  Former Smoker    Last attempt to quit: 07/20/2015    Years since quitting: 2.2  . Smokeless tobacco: Never Used  Substance and Sexual Activity  . Alcohol use: Yes    Alcohol/week: 3.6 oz    Types: 6 Glasses of wine per week    Comment: weekly  . Drug use: No  . Sexual activity: Yes    Partners: Male    Birth control/protection: Condom  Lifestyle  . Physical activity:    Days per week: Not on file    Minutes per session: Not on file  . Stress: Not on file  Relationships  . Social connections:    Talks on phone:  Not on file    Gets together: Not on file    Attends religious service: Not on file    Active member of club or organization: Not on file    Attends meetings of clubs or organizations: Not on file    Relationship status: Not on file  . Intimate partner violence:    Fear of current or ex partner: Not on file    Emotionally abused: Not on file    Physically abused: Not on file    Forced sexual activity: Not on file  Other Topics Concern  . Not on file  Social History Narrative  . Not on file    Family History  Problem Relation Age of Onset  . Hyperlipidemia Mother   . Migraines Mother   . Migraines Sister   . Hyperlipidemia Brother   . Hypertension Brother   . Cancer Maternal Grandfather        colon cancer  . Diabetes Maternal Grandfather   . Cancer Paternal Grandmother        skin cancer  . Cancer Paternal Grandfather        bladder cancer/colon cancer    The following portions of the patient's history were reviewed and updated as appropriate: allergies, current medications, past family history, past medical history, past social history, past surgical history and problem list.  Review of Systems  ROS negative except as noted above. Information obtained from patient.    Objective:   BP 133/73   Pulse (!) 58   Ht  (1.778 m)   Wt 151 lb 1.6 oz (68.5 kg)   LMP 09/27/2017   BMI 21.68 kg/m    CONSTITUTIONAL: Well-developed, well-nourished female in no acute distress.   PSYCHIATRIC: Normal mood and affect. Normal behavior. Normal judgment and thought content.  NEUROLGIC: Alert and oriented to person, place, and time. Normal muscle tone coordination. No cranial nerve deficit noted.  HENT:  Normocephalic, atraumatic, External right and left ear normal.   EYES: Conjunctivae and EOM are normal. Pupils are equal and round.   NECK: Normal range of motion, supple, no masses.  Normal thyroid.   SKIN: Skin is warm and dry. No rash noted. Not diaphoretic. No  erythema. No pallor.  CARDIOVASCULAR: Normal heart rate noted, regular rhythm, no murmur.  RESPIRATORY: Clear to auscultation bilaterally. Effort and breath sounds normal, no problems with respiration noted.  BREASTS: Symmetric in size. No masses, skin changes, or lymphadenopathy. Left nipple slight inverted, everts without difficulty.   ABDOMEN: Soft, normal bowel sounds, no distention noted.  No tenderness, rebound or guarding.   PELVIC:  External Genitalia: Normal  BUS: Normal  Vagina: Normal  Cervix: Normal  Uterus: Normal  Adnexa: Normal  MUSCULOSKELETAL: Normal range of motion. No tenderness.  No cyanosis, clubbing, or edema.  2+ distal  pulses.  LYMPHATIC: No Axillary, Supraclavicular, or Inguinal Adenopathy.  Assessment:   Annual gynecologic examination 27 y.o.   Contraception: condoms   Normal BMI   Problem List Items Addressed This Visit      Other   Family history of PCOS   Relevant Orders   CBC   Comprehensive metabolic panel   DHEA-sulfate   Prolactin   Testosterone, Free, Total, SHBG   TSH   FSH/LH    Other Visit Diagnoses    Vaginal discharge    -  Primary   Relevant Orders   NuSwab Vaginitis Plus (VG+)   Screening for malignant neoplasm of cervix       Relevant Orders   Pap IG, rfx HPV ASCU,16/18   Well woman exam with routine gynecological exam       Relevant Orders   Pap IG, rfx HPV ASCU,16/18   NuSwab Vaginitis Plus (VG+)   CBC   Comprehensive metabolic panel   DHEA-sulfate   Prolactin   Testosterone, Free, Total, SHBG   TSH   FSH/LH   Bilateral nipple discharge       Relevant Orders   Prolactin   Excessive hair growth       Relevant Orders   Comprehensive metabolic panel   DHEA-sulfate   Prolactin   Testosterone, Free, Total, SHBG   TSH   FSH/LH   Acne, unspecified acne type       Relevant Orders   Comprehensive metabolic panel   DHEA-sulfate   Prolactin   Testosterone, Free, Total, SHBG   TSH   FSH/LH   Short  menstrual cycle       Relevant Orders   CBC   Comprehensive metabolic panel   DHEA-sulfate   Prolactin   Testosterone, Free, Total, SHBG   TSH   FSH/LH      Plan:   Pap: Pap, Reflex if ASCUS  Labs: See orders   Rx: Macrobid, see orders  Routine preventative health maintenance measures emphasized: Exercise/Diet/Weight control, Tobacco Warnings, Alcohol/Substance use risks, Stress Management, Peer Pressure Issues and Safe Sex; see AVS  Reviewed red flag symptoms and when to call.   RTC x 1 week for labs and [place], RTC x 2 weeks for results review  RTC x 1 year for Annual exam or sooner if needed   Gunnar Bulla, CNM [facility]

## 2017-10-19 NOTE — Patient Instructions (Signed)
Galactorrhea Galactorrhea is the flow of a milky fluid (discharge) from the breast. It is different from normal milk in nursing mothers. It can be caused by many things. Most cases are not serious and do not require treatment. Watch your condition to make sure it goes away. Follow these instructions at home:  Take medicines only as told by your doctor.  Do not squeeze your breasts or nipples.  Avoid any touching of your breasts during sexual activity.  Perform a breast self-exam only once a month.  Avoid clothes that rub on your nipples.  Use breast pads to absorb the fluid.  Wear a support bra or a breast binder.  Keep all follow-up visits as told by your doctor. This is important. Contact a doctor if:  You have hot flashes.  You have vaginal dryness.  You have a lack of sexual desire.  You stop having menstrual periods, or they are far apart or not regular.  You have headaches.  You have vision problems. Get help right away if:  Your breast discharge is bloody or yellowish white (puslike).  You have breast pain.  You feel a lump in your breast.  Your breast shows wrinkling or dimpling.  Your breast becomes red and swollen. This information is not intended to replace advice given to you by your health care provider. Make sure you discuss any questions you have with your health care provider. Document Released: 07/12/2010 Document Revised: 11/15/2015 Document Reviewed: 01/10/2014 Elsevier Interactive Patient Education  2018 [employer]. Nitrofurantoin tablets or capsules What is this medicine? NITROFURANTOIN (nye troe fyoor AN toyn) is an antibiotic. It is used to treat urinary tract infections. This medicine may be used for other purposes; ask your health care provider or pharmacist if you have questions. COMMON BRAND NAME(S): Macrobid, Macrodantin, Urotoin What should I tell my health care provider before I take this medicine? They need to know if you have any  of these conditions: -anemia -diabetes -glucose-6-phosphate dehydrogenase deficiency -kidney disease -liver disease -lung disease -other chronic illness -an unusual or allergic reaction to nitrofurantoin, other antibiotics, other medicines, foods, dyes or preservatives -pregnant or trying to get pregnant -breast-feeding How should I use this medicine? Take this medicine by mouth with a glass of water. Follow the directions on the prescription label. Take this medicine with food or milk. Take your doses at regular intervals. Do not take your medicine more often than directed. Do not stop taking except on your doctor's advice. Talk to your pediatrician regarding the use of this medicine in children. While this drug may be prescribed for selected conditions, precautions do apply. Overdosage: If you think you have taken too much of this medicine contact a poison control center or emergency room at once. NOTE: This medicine is only for you. Do not share this medicine with others. What if I miss a dose? If you miss a dose, take it as soon as you can. If it is almost time for your next dose, take only that dose. Do not take double or extra doses. What may interact with this medicine? -antacids containing magnesium trisilicate -probenecid -quinolone antibiotics like ciprofloxacin, lomefloxacin, norfloxacin and ofloxacin -sulfinpyrazone This list may not describe all possible interactions. Give your health care provider a list of all the medicines, herbs, non-prescription drugs, or dietary supplements you use. Also tell them if you smoke, drink alcohol, or use illegal drugs. Some items may interact with your medicine. What should I watch for while using this medicine? Tell your  doctor or health care professional if your symptoms do not improve or if you get new symptoms. Drink several glasses of water a day. If you are taking this medicine for a long time, visit your doctor for regular checks on your  progress. If you are diabetic, you may get a false positive result for sugar in your urine with certain brands of urine tests. Check with your doctor. What side effects may I notice from receiving this medicine? Side effects that you should report to your doctor or health care professional as soon as possible: -allergic reactions like skin rash or hives, swelling of the face, lips, or tongue -chest pain -cough -difficulty breathing -dizziness, drowsiness -fever or infection -joint aches or pains -pale or blue-tinted skin -redness, blistering, peeling or loosening of the skin, including inside the mouth -tingling, burning, pain, or numbness in hands or feet -unusual bleeding or bruising -unusually weak or tired -yellowing of eyes or skin Side effects that usually do not require medical attention (report to your doctor or health care professional if they continue or are bothersome): -dark urine -diarrhea -headache -loss of appetite -nausea or vomiting -temporary hair loss This list may not describe all possible side effects. Call your doctor for medical advice about side effects. You may report side effects to FDA at 1-800-FDA-1088. Where should I keep my medicine? Keep out of the reach of children. Store at room temperature between 15 and 30 degrees C (59 and 86 degrees F). Protect from light. Throw away any unused medicine after the expiration date. NOTE: This sheet is a summary. It may not cover all possible information. If you have questions about this medicine, talk to your doctor, pharmacist, or health care provider.  2018 Elsevier/Gold Standard (2007-12-29 15:56:47) Spironolactone tablets What is this medicine? SPIRONOLACTONE (speer on oh LAK tone) is a diuretic. It helps you make more urine and to lose excess water from your body. This medicine is used to treat high blood pressure, and edema or swelling from heart, kidney, or liver disease. It is also used to treat patients who  make too much aldosterone or have low potassium. This medicine may be used for other purposes; ask your health care provider or pharmacist if you have questions. COMMON BRAND NAME(S): Aldactone What should I tell my health care provider before I take this medicine? They need to know if you have any of these conditions: -high blood level of potassium -kidney disease or trouble making urine -liver disease -an unusual or allergic reaction to spironolactone, other medicines, foods, dyes, or preservatives -pregnant or trying to get pregnant -breast-feeding How should I use this medicine? Take this medicine by mouth with a drink of water. Follow the directions on your prescription label. You can take it with or without food. If it upsets your stomach, take it with food. Do not take your medicine more often than directed. Remember that you will need to pass more urine after taking this medicine. Do not take your doses at a time of day that will cause you problems. Do not take at bedtime. Talk to your pediatrician regarding the use of this medicine in children. While this drug may be prescribed for selected conditions, precautions do apply. Overdosage: If you think you have taken too much of this medicine contact a poison control center or emergency room at once. NOTE: This medicine is only for you. Do not share this medicine with others. What if I miss a dose? If you miss a dose, take  it as soon as you can. If it is almost time for your next dose, take only that dose. Do not take double or extra doses. What may interact with this medicine? Do not take this medicine with any of the following medications: -eplerenone This medicine may also interact with the following medications: -corticosteroids -digoxin -lithium -medicines for high blood pressure like ACE inhibitors -skeletal muscle relaxants like tubocurarine -NSAIDs, medicines for pain and inflammation, like ibuprofen or naproxen -potassium  products like salt substitute or supplements -pressor amines like norepinephrine -some diuretics This list may not describe all possible interactions. Give your health care provider a list of all the medicines, herbs, non-prescription drugs, or dietary supplements you use. Also tell them if you smoke, drink alcohol, or use illegal drugs. Some items may interact with your medicine. What should I watch for while using this medicine? Visit your doctor or health care professional for regular checks on your progress. Check your blood pressure as directed. Ask your doctor what your blood pressure should be, and when you should contact them. You may need to be on a special diet while taking this medicine. Ask your doctor. Also, ask how many glasses of fluid you need to drink a day. You must not get dehydrated. This medicine may make you feel confused, dizzy or lightheaded. Drinking alcohol and taking some medicines can make this worse. Do not drive, use machinery, or do anything that needs mental alertness until you know how this medicine affects you. Do not sit or stand up quickly. What side effects may I notice from receiving this medicine? Side effects that you should report to your doctor or health care professional as soon as possible: -allergic reactions such as skin rash or itching, hives, swelling of the lips, mouth, tongue, or throat -black or tarry stools -fast, irregular heartbeat -fever -muscle pain, cramps -numbness, tingling in hands or feet -trouble breathing -trouble passing urine -unusual bleeding -unusually weak or tired Side effects that usually do not require medical attention (report to your doctor or health care professional if they continue or are bothersome): -change in voice or hair growth -confusion -dizzy, drowsy -dry mouth, increased thirst -enlarged or tender breasts -headache -irregular menstrual periods -sexual difficulty, unable to have an erection -stomach  upset This list may not describe all possible side effects. Call your doctor for medical advice about side effects. You may report side effects to FDA at 1-800-FDA-1088. Where should I keep my medicine? Keep out of the reach of children. Store below 25 degrees C (77 degrees F). Throw away any unused medicine after the expiration date. NOTE: This sheet is a summary. It may not cover all possible information. If you have questions about this medicine, talk to your doctor, pharmacist, or health care provider.  2018 Elsevier/Gold Standard (2010-02-19 12:51:30) Polycystic Ovarian Syndrome Polycystic ovarian syndrome (PCOS) is a common hormonal disorder among women of reproductive age. In most women with PCOS, many small fluid-filled sacs (cysts) grow on the ovaries, and the cysts are not part of a normal menstrual cycle. PCOS can cause problems with your menstrual periods and make it difficult to get pregnant. It can also cause an increased risk of miscarriage with pregnancy. If it is not treated, PCOS can lead to serious health problems, such as diabetes and heart disease. What are the causes? The cause of PCOS is not known, but it may be the result of a combination of certain factors, such as:  Irregular menstrual cycle.  High levels of  certain hormones (androgens).  Problems with the hormone that helps to control blood sugar (insulin resistance).  Certain genes.  What increases the risk? This condition is more likely to develop in women who have a family history of PCOS. What are the signs or symptoms? Symptoms of PCOS may include:  Multiple ovarian cysts.  Infrequent periods or no periods.  Periods that are too frequent or too heavy.  Unpredictable periods.  Inability to get pregnant (infertility) because of not ovulating.  Increased growth of hair on the face, chest, stomach, back, thumbs, thighs, or toes.  Acne or oily skin. Acne may develop during adulthood, and it may not  respond to treatment.  Pelvic pain.  Weight gain or obesity.  Patches of thickened and dark brown or black skin on the neck, arms, breasts, or thighs (acanthosis nigricans).  Excess hair growth on the face, chest, abdomen, or upper thighs (hirsutism).  How is this diagnosed? This condition is diagnosed based on:  Your medical history.  A physical exam, including a pelvic exam. Your health care provider may look for areas of increased hair growth on your skin.  Tests, such as: ? Ultrasound. This may be used to examine the ovaries and the lining of the uterus (endometrium) for cysts. ? Blood tests. These may be used to check levels of sugar (glucose), female hormone (testosterone), and female hormones (estrogen and progesterone) in your blood.  How is this treated? There is no cure for PCOS, but treatment can help to manage symptoms and prevent more health problems from developing. Treatment varies depending on:  Your symptoms.  Whether you want to have a baby or whether you need birth control (contraception).  Treatment may include nutrition and lifestyle changes along with:  Progesterone hormone to start a menstrual period.  Birth control pills to help you have regular menstrual periods.  Medicines to make you ovulate, if you want to get pregnant.  Medicine to reduce excessive hair growth.  Surgery, in severe cases. This may involve making small holes in one or both of your ovaries. This decreases the amount of testosterone that your body produces.  Follow these instructions at home:  Take over-the-counter and prescription medicines only as told by your health care provider.  Follow a healthy meal plan. This can help you reduce the effects of PCOS. ? Eat a healthy diet that includes lean proteins, complex carbohydrates, fresh fruits and vegetables, low-fat dairy products, and healthy fats. Make sure to eat enough fiber.  If you are overweight, lose weight as told by your  health care provider. ? Losing 10% of your body weight may improve symptoms. ? Your health care provider can determine how much weight loss is best for you and can help you lose weight safely.  Keep all follow-up visits as told by your health care provider. This is important. Contact a health care provider if:  Your symptoms do not get better with medicine.  You develop new symptoms. This information is not intended to replace advice given to you by your health care provider. Make sure you discuss any questions you have with your health care provider. Document Released: 10/03/2004 Document Revised: 02/05/2016 Document Reviewed: 11/25/2015 Elsevier Interactive Patient Education  2018 [employer]. Diet for Polycystic Ovarian Syndrome Polycystic ovary syndrome (PCOS) is a disorder of the chemical messengers (hormones) that regulate menstruation. The condition causes important hormones to be out of balance. PCOS can:  Make your periods irregular or stop.  Cause cysts to develop on  the ovaries.  Make it difficult to get pregnant.  Stop your body from responding to the effects of insulin (insulin resistance), which can lead to obesity and diabetes.  Changing what you eat can help manage PCOS and improve your health. It can help you lose weight and improve the way your body uses insulin. What is my plan?  Eat breakfast, lunch, and dinner plus two snacks every day.  Include protein in each meal and snack.  Choose whole grains instead of products made with refined flour.  Eat a variety of foods.  Exercise regularly as told by your health care provider. What do I need to know about this eating plan? If you are overweight or obese, pay attention to how many calories you eat. Cutting down on calories can help you lose weight. Work with your health care provider or dietitian to figure out how many calories you need each day. What foods can I eat? Grains Whole grains, such as whole wheat.  Whole-grain breads, crackers, cereals, and pasta. Unsweetened oatmeal, bulgur, barley, quinoa, or brown rice. Corn or whole-wheat flour tortillas. Vegetables  Lettuce. Spinach. Peas. Beets. Cauliflower. Cabbage. Broccoli. Carrots. Tomatoes. Squash. Eggplant. Herbs. Peppers. Onions. Cucumbers. Brussels sprouts. Fruits Berries. Bananas. Apples. Oranges. Grapes. Papaya. Mango. Pomegranate. Kiwi. Grapefruit. Cherries. Meats and Other Protein Sources Lean proteins, such as fish, chicken, beans, eggs, and tofu. Dairy Low-fat dairy products, such as skim milk, cheese sticks, and yogurt. Beverages Low-fat or fat-free drinks, such as water, low-fat milk, sugar-free drinks, and 100% fruit juice. Condiments Ketchup. Mustard. Barbecue sauce. Relish. Low-fat or fat-free mayonnaise. Fats and Oils Olive oil or canola oil. Walnuts and almonds. The items listed above may not be a complete list of recommended foods or beverages. Contact your dietitian for more options. What foods are not recommended? Foods high in calories or fat. Fried foods. Sweets. Products made from refined white flour, including white bread, pastries, white rice, and pasta. The items listed above may not be a complete list of foods and beverages to avoid. Contact your dietitian for more information. This information is not intended to replace advice given to you by your health care provider. Make sure you discuss any questions you have with your health care provider. Document Released: 10/01/2015 Document Revised: 11/15/2015 Document Reviewed: 06/21/2014 Elsevier Interactive Patient Education  2018 [place] 18-39 Years, Female Preventive care refers to lifestyle choices and visits with your health care provider that can promote health and wellness. What does preventive care include?  A yearly physical exam. This is also called an annual well check.  Dental exams once or twice a year.  Routine eye exams. Ask  your health care provider how often you should have your eyes checked.  Personal lifestyle choices, including: ? Daily care of your teeth and gums. ? Regular physical activity. ? Eating a healthy diet. ? Avoiding tobacco and drug use. ? Limiting alcohol use. ? Practicing safe sex. ? Taking vitamin and mineral supplements as recommended by your health care provider. What happens during an annual well check? The services and screenings done by your health care provider during your annual well check will depend on your age, overall health, lifestyle risk factors, and family history of disease. Counseling Your health care provider may ask you questions about your:  Alcohol use.  Tobacco use.  Drug use.  Emotional well-being.  Home and relationship well-being.  Sexual activity.  Eating habits.  Work and work Statistician.  Method of birth control.  Menstrual cycle.  Pregnancy history.  Screening You may have the following tests or measurements:  Height, weight, and BMI.  Diabetes screening. This is done by checking your blood sugar (glucose) after you have not eaten for a while (fasting).  Blood pressure.  Lipid and cholesterol levels. These may be checked every 5 years starting at age 108.  Skin check.  Hepatitis C blood test.  Hepatitis B blood test.  Sexually transmitted disease (STD) testing.  BRCA-related cancer screening. This may be done if you have a family history of breast, ovarian, tubal, or peritoneal cancers.  Pelvic exam and Pap test. This may be done every 3 years starting at age 79. Starting at age 31, this may be done every 5 years if you have a Pap test in combination with an HPV test.  Discuss your test results, treatment options, and if necessary, the need for more tests with your health care provider. Vaccines Your health care provider may recommend certain vaccines, such as:  Influenza vaccine. This is recommended every year.  Tetanus,  diphtheria, and acellular pertussis (Tdap, Td) vaccine. You may need a Td booster every 10 years.  Varicella vaccine. You may need this if you have not been vaccinated.  HPV vaccine. If you are 62 or younger, you may need three doses over 6 months.  Measles, mumps, and rubella (MMR) vaccine. You may need at least one dose of MMR. You may also need a second dose.  Pneumococcal 13-valent conjugate (PCV13) vaccine. You may need this if you have certain conditions and were not previously vaccinated.  Pneumococcal polysaccharide (PPSV23) vaccine. You may need one or two doses if you smoke cigarettes or if you have certain conditions.  Meningococcal vaccine. One dose is recommended if you are age 26-21 years and a first-year college student living in a residence hall, or if you have one of several medical conditions. You may also need additional booster doses.  Hepatitis A vaccine. You may need this if you have certain conditions or if you travel or work in places where you may be exposed to hepatitis A.  Hepatitis B vaccine. You may need this if you have certain conditions or if you travel or work in places where you may be exposed to hepatitis B.  Haemophilus influenzae type b (Hib) vaccine. You may need this if you have certain risk factors.  Talk to your health care provider about which screenings and vaccines you need and how often you need them. This information is not intended to replace advice given to you by your health care provider. Make sure you discuss any questions you have with your health care provider. Document Released: 08/05/2001 Document Revised: 02/27/2016 Document Reviewed: 04/10/2015 Elsevier Interactive Patient Education  [employer].

## 2017-10-21 LAB — PAP IG, RFX HPV ASCU,16/18: PAP SMEAR COMMENT: 0

## 2017-10-22 LAB — NUSWAB VAGINITIS PLUS (VG+)
Candida albicans, NAA: NEGATIVE
Candida glabrata, NAA: NEGATIVE
Chlamydia trachomatis, NAA: NEGATIVE
NEISSERIA GONORRHOEAE, NAA: NEGATIVE
TRICH VAG BY NAA: NEGATIVE

## 2017-10-23 ENCOUNTER — Encounter: Payer: Self-pay | Admitting: Certified Nurse Midwife

## 2017-10-26 ENCOUNTER — Other Ambulatory Visit: Payer: BLUE CROSS/BLUE SHIELD

## 2017-10-26 ENCOUNTER — Ambulatory Visit (INDEPENDENT_AMBULATORY_CARE_PROVIDER_SITE_OTHER): Payer: BLUE CROSS/BLUE SHIELD

## 2017-10-26 DIAGNOSIS — N920 Excessive and frequent menstruation with regular cycle: Secondary | ICD-10-CM

## 2017-10-26 DIAGNOSIS — L689 Hypertrichosis, unspecified: Secondary | ICD-10-CM | POA: Diagnosis not present

## 2017-10-26 DIAGNOSIS — Z842 Family history of other diseases of the genitourinary system: Secondary | ICD-10-CM

## 2017-10-26 DIAGNOSIS — L709 Acne, unspecified: Secondary | ICD-10-CM | POA: Diagnosis not present

## 2017-10-26 DIAGNOSIS — N6452 Nipple discharge: Secondary | ICD-10-CM

## 2017-10-26 DIAGNOSIS — Z01419 Encounter for gynecological examination (general) (routine) without abnormal findings: Secondary | ICD-10-CM

## 2017-10-27 LAB — COMPREHENSIVE METABOLIC PANEL
A/G RATIO: 2.2 (ref 1.2–2.2)
ALK PHOS: 54 IU/L (ref 39–117)
ALT: 12 IU/L (ref 0–32)
AST: 14 IU/L (ref 0–40)
Albumin: 4.7 g/dL (ref 3.5–5.5)
BUN/Creatinine Ratio: 13 (ref 9–23)
BUN: 11 mg/dL (ref 6–20)
Bilirubin Total: 0.7 mg/dL (ref 0.0–1.2)
CALCIUM: 9.5 mg/dL (ref 8.7–10.2)
CHLORIDE: 102 mmol/L (ref 96–106)
CO2: 22 mmol/L (ref 20–29)
Creatinine, Ser: 0.82 mg/dL (ref 0.57–1.00)
GFR, EST AFRICAN AMERICAN: 115 mL/min/{1.73_m2} (ref >59)
GFR, EST NON AFRICAN AMERICAN: 100 mL/min/{1.73_m2} (ref >59)
GLOBULIN, TOTAL: 2.1 g/dL (ref 1.5–4.5)
Glucose: 75 mg/dL (ref 65–99)
POTASSIUM: 4.4 mmol/L (ref 3.5–5.2)
Sodium: 141 mmol/L (ref 134–144)
Total Protein: 6.8 g/dL (ref 6.0–8.5)

## 2017-10-27 LAB — CBC
HEMOGLOBIN: 12.6 g/dL (ref 11.1–15.9)
Hematocrit: 39.5 % (ref 34.0–46.6)
MCH: 30.7 pg (ref 26.6–33.0)
MCHC: 31.9 g/dL (ref 31.5–35.7)
MCV: 96 fL (ref 79–97)
Platelets: 323 10*3/uL (ref 150–379)
RBC: 4.11 x10E6/uL (ref 3.77–5.28)
RDW: 15.4 % (ref 12.3–15.4)
WBC: 5.2 10*3/uL (ref 3.4–10.8)

## 2017-10-27 LAB — TESTOSTERONE, FREE, TOTAL, SHBG
Sex Hormone Binding: 95.5 nmol/L (ref 24.6–122.0)
TESTOSTERONE FREE: 2.5 pg/mL (ref 0.0–4.2)
TESTOSTERONE: 28 ng/dL (ref 8–48)

## 2017-10-27 LAB — FSH/LH
FSH: 7.5 m[IU]/mL
LH: 7.5 m[IU]/mL

## 2017-10-27 LAB — DHEA-SULFATE: DHEA SO4: 220.1 ug/dL (ref 84.8–378.0)

## 2017-10-27 LAB — TSH: TSH: 2.09 u[IU]/mL (ref 0.450–4.500)

## 2017-10-27 LAB — PROLACTIN: PROLACTIN: 23.7 ng/mL — AB (ref 4.8–23.3)

## 2017-11-02 ENCOUNTER — Ambulatory Visit (INDEPENDENT_AMBULATORY_CARE_PROVIDER_SITE_OTHER): Payer: BLUE CROSS/BLUE SHIELD | Admitting: Certified Nurse Midwife

## 2017-11-02 VITALS — BP 113/76 | HR 63 | Ht 70.0 in | Wt 151.6 lb

## 2017-11-02 DIAGNOSIS — Z842 Family history of other diseases of the genitourinary system: Secondary | ICD-10-CM

## 2017-11-02 DIAGNOSIS — L709 Acne, unspecified: Secondary | ICD-10-CM | POA: Diagnosis not present

## 2017-11-02 DIAGNOSIS — N939 Abnormal uterine and vaginal bleeding, unspecified: Secondary | ICD-10-CM | POA: Insufficient documentation

## 2017-11-02 DIAGNOSIS — Z09 Encounter for follow-up examination after completed treatment for conditions other than malignant neoplasm: Secondary | ICD-10-CM | POA: Diagnosis not present

## 2017-11-02 DIAGNOSIS — L689 Hypertrichosis, unspecified: Secondary | ICD-10-CM | POA: Insufficient documentation

## 2017-11-02 DIAGNOSIS — Z5181 Encounter for therapeutic drug level monitoring: Secondary | ICD-10-CM | POA: Diagnosis not present

## 2017-11-02 MED ORDER — SPIRONOLACTONE 50 MG PO TABS
50.0000 mg | ORAL_TABLET | Freq: Every day | ORAL | 2 refills | Status: DC
Start: 2017-11-02 — End: 2018-03-29

## 2017-11-02 NOTE — Patient Instructions (Addendum)
Acne Acne is a skin problem that causes small, red bumps (pimples). Acne happens when the tiny holes in your skin (pores) get blocked. Your pores may become red, sore, and swollen. They may also become infected. Acne is a common skin problem. It is especially common in teenagers. Acne usually goes away over time. Follow these instructions at home: Good skin care is the most important thing you can do to treat your acne. Take care of your skin as told by your doctor. You may be told to do these things:  Wash your skin gently at least two times each day. You should also wash your skin: ? After you exercise. ? Before you go to bed.  Use mild soap.  Use a water-based skin moisturizer after you wash your skin.  Use a sunscreen or sunblock with SPF 30 or greater. This is very important if you are using acne medicines.  Choose cosmetics that will not plug your oil glands (are noncomedogenic).  Medicines  Take over-the-counter and prescription medicines only as told by your doctor.  If you were prescribed an antibiotic medicine, apply or take it as told by your doctor. Do not stop using the antibiotic even if your acne improves. General instructions  Keep your hair clean and off of your face. Shampoo your hair regularly. If you have oily hair, you may need to wash it every day.  Avoid leaning your chin or forehead on your hands.  Avoid wearing tight headbands or hats.  Avoid picking or squeezing your pimples. That can make your acne worse and cause scarring.  Keep all follow-up visits as told by your doctor. This is important.  Shave gently. Only shave when it is necessary.  Keep a food journal. This can help you to see if any foods are linked with your acne. Contact a doctor if:  Your acne is not better after eight weeks.  Your acne gets worse.  You have a large area of skin that is red or tender.  You think that you are having side effects from any acne medicine. This  information is not intended to replace advice given to you by your health care provider. Make sure you discuss any questions you have with your health care provider. Document Released: 05/29/2011 Document Revised: 11/15/2015 Document Reviewed: 08/16/2014 Elsevier Interactive Patient Education  2018 [employer] Tretinoin skin cream (Acne) What is this medicine? TRETINOIN (TRET i noe in) is a naturally occurring form of vitamin A. It is used on the skin to treat mild to moderate acne. This medicine may be used for other purposes; ask your health care provider or pharmacist if you have questions. COMMON BRAND NAME(S): [place]in[place], AVITA, Refissa, Retin-A, Tretin-X What should I tell my health care provider before I take this medicine? They need to know if you have any of these conditions: -eczema -excessive sensitivity to the sun -sunburn -an unusual or allergic reaction to tretinoin, vitamin A, other medicines, foods, dyes, or preservatives -pregnant or trying to get pregnant -breast-feeding How should I use this medicine? This medicine is for external use only. Do not take by mouth. Follow the directions on the prescription label. Gently wash your face with a mild, non-medicated soap before use. Pat the skin dry. Wait 20 to 30 minutes for your skin to dry before use in order to minimize the possibility of skin irritation. Apply enough medicine to cover the affected area and rub in gently. Avoid applying this medicine to your eyes, ears, nostrils, angles  of the nose, and mouth. Do not use more often than your doctor or health care professional has recommended. Using too much of this medicine may irritate or increase the irritation of your skin, and will not give faster or better results. Talk to your pediatrician regarding the use of this medicine in children. While this drug may be prescribed for children as young as 54 years of age for selected conditions, precautions do apply. Overdosage: If you  think you have taken too much of this medicine contact a poison control center or emergency room at once. NOTE: This medicine is only for you. Do not share this medicine with others. What if I miss a dose? If you miss a dose, skip that dose and continue with your regular schedule. Do not use extra doses, or use for a longer period of time than directed by your doctor or health care professional. What may interact with this medicine? -medicines or other preparations that may dry your skin such as benzoyl peroxide or salicylic acid -medicines that increase your sensitivity to sunlight such as tetracycline or sulfa drugs This list may not describe all possible interactions. Give your health care provider a list of all the medicines, herbs, non-prescription drugs, or dietary supplements you use. Also tell them if you smoke, drink alcohol, or use illegal drugs. Some items may interact with your medicine. What should I watch for while using this medicine? Your acne may get worse initially and should then start to improve. It may take 2 to 12 weeks before you see the full effect. Do not wash your face more than 2 or 3 times a day, unless directed by your doctor or health care professional. Do not use the following products on the same areas that you are treating with this medicine, unless otherwise directed by your doctor or health care professional: other topical agents with a strong skin drying effect such as products with a high alcohol content, astringents, spices, the peel of lime or other citrus, medicated soaps or shampoos, permanent wave solutions, electrolysis, hair removers or waxes, or any other preparations or processes that might dry or irritate your skin. This medicine can make you more sensitive to the sun. Keep out of the sun. If you cannot avoid being in the sun, wear protective clothing and use sunscreen. Do not use sun lamps or tanning beds/booths. Avoid cold weather and wind as much as  possible, and use clothing to protect you from the weather. Skin treated with this medicine may dry out or get wind burned more easily. What side effects may I notice from receiving this medicine? Side effects that you should report to your doctor or health care professional as soon as possible: -darkening or lightening of the treated areas -severe burning, itching, crusting, or swelling of the treated areas Side effects that usually do not require medical attention (report to your doctor or health care professional if they continue or are bothersome): -increased sensitivity to the sun -itching -mild stinging -red, inflamed, and irritated skin, the skin may peel after a few days This list may not describe all possible side effects. Call your doctor for medical advice about side effects. You may report side effects to FDA at 1-800-FDA-1088. Where should I keep my medicine? Keep out of the reach of children. Store below 27 degrees C (80 degrees F). Do not freeze. Protect from light. Throw away any unused medicine after the expiration date. NOTE: This sheet is a summary. It may not cover  all possible information. If you have questions about this medicine, talk to your doctor, pharmacist, or health care provider.  2018 Elsevier/Gold Standard (2008-02-23 17:38:22) Spironolactone tablets What is this medicine? SPIRONOLACTONE ([place] LAK tone) is a diuretic. It helps you make more urine and to lose excess water from your body. This medicine is used to treat high blood pressure, and edema or swelling from heart, kidney, or liver disease. It is also used to treat patients who make too much aldosterone or have low potassium. This medicine may be used for other purposes; ask your health care provider or pharmacist if you have questions. COMMON BRAND NAME(S): Aldactone What should I tell my health care provider before I take this medicine? They need to know if you have any of these conditions: -high  blood level of potassium -kidney disease or trouble making urine -liver disease -an unusual or allergic reaction to spironolactone, other medicines, foods, dyes, or preservatives -pregnant or trying to get pregnant -breast-feeding How should I use this medicine? Take this medicine by mouth with a drink of water. Follow the directions on your prescription label. You can take it with or without food. If it upsets your stomach, take it with food. Do not take your medicine more often than directed. Remember that you will need to pass more urine after taking this medicine. Do not take your doses at a time of day that will cause you problems. Do not take at bedtime. Talk to your pediatrician regarding the use of this medicine in children. While this drug may be prescribed for selected conditions, precautions do apply. Overdosage: If you think you have taken too much of this medicine contact a poison control center or emergency room at once. NOTE: This medicine is only for you. Do not share this medicine with others. What if I miss a dose? If you miss a dose, take it as soon as you can. If it is almost time for your next dose, take only that dose. Do not take double or extra doses. What may interact with this medicine? Do not take this medicine with any of the following medications: -eplerenone This medicine may also interact with the following medications: -corticosteroids -digoxin -lithium -medicines for high blood pressure like ACE inhibitors -skeletal muscle relaxants like tubocurarine -NSAIDs, medicines for pain and inflammation, like ibuprofen or naproxen -potassium products like salt substitute or supplements -pressor amines like norepinephrine -some diuretics This list may not describe all possible interactions. Give your health care provider a list of all the medicines, herbs, non-prescription drugs, or dietary supplements you use. Also tell them if you smoke, drink alcohol, or use  illegal drugs. Some items may interact with your medicine. What should I watch for while using this medicine? Visit your doctor or health care professional for regular checks on your progress. Check your blood pressure as directed. Ask your doctor what your blood pressure should be, and when you should contact them. You may need to be on a special diet while taking this medicine. Ask your doctor. Also, ask how many glasses of fluid you need to drink a day. You must not get dehydrated. This medicine may make you feel confused, dizzy or lightheaded. Drinking alcohol and taking some medicines can make this worse. Do not drive, use machinery, or do anything that needs mental alertness until you know how this medicine affects you. Do not sit or stand up quickly. What side effects may I notice from receiving this medicine? Side effects that  you should report to your doctor or health care professional as soon as possible: -allergic reactions such as skin rash or itching, hives, swelling of the lips, mouth, tongue, or throat -black or tarry stools -fast, irregular heartbeat -fever -muscle pain, cramps -numbness, tingling in hands or feet -trouble breathing -trouble passing urine -unusual bleeding -unusually weak or tired Side effects that usually do not require medical attention (report to your doctor or health care professional if they continue or are bothersome): -change in voice or hair growth -confusion -dizzy, drowsy -dry mouth, increased thirst -enlarged or tender breasts -headache -irregular menstrual periods -sexual difficulty, unable to have an erection -stomach upset This list may not describe all possible side effects. Call your doctor for medical advice about side effects. You may report side effects to FDA at 1-800-FDA-1088. Where should I keep my medicine? Keep out of the reach of children. Store below 25 degrees C (77 degrees F). Throw away any unused medicine after the  expiration date. NOTE: This sheet is a summary. It may not cover all possible information. If you have questions about this medicine, talk to your doctor, pharmacist, or health care provider.  2018 Elsevier/Gold Standard (2010-02-19 12:51:30) Drospirenone; Ethinyl Estradiol tablets What is this medicine? DROSPIRENONE; ETHINYL ESTRADIOL (dro SPY re nown; ETH in il es tra DYE ole) is an oral contraceptive (birth control pill). This medicine combines two types of female hormones, an estrogen and a progestin. It is used to prevent ovulation and pregnancy. This medicine may be used for other purposes; ask your health care provider or pharmacist if you have questions. COMMON BRAND NAME(S): Evelena Peat What should I tell my health care provider before I take this medicine? They need to know if you have or ever had any of these conditions: -abnormal vaginal bleeding -adrenal gland disease -blood vessel disease or blood clots -breast, cervical, endometrial, ovarian, liver, or uterine cancer -diabetes -gallbladder disease -heart disease or recent heart attack -high blood pressure -high cholesterol -high potassium level -kidney disease -liver disease -migraine headaches -stroke -systemic lupus erythematosus (SLE) -tobacco smoker -an unusual or allergic reaction to estrogens, progestins, or other medicines, foods, dyes, or preservatives -pregnant or trying to get pregnant -breast-feeding How should I use this medicine? Take this medicine by mouth. To reduce nausea, this medicine may be taken with food. Follow the directions on the prescription label. Take this medicine at the same time each day and in the order directed on the package. Do not take your medicine more often than directed. A patient package insert for the product will be given with each prescription and refill. Read this sheet carefully each time. The sheet may change  frequently. Talk to your pediatrician regarding the use of this medicine in children. Special care may be needed. This medicine has been used in female children who have started having menstrual periods. Overdosage: If you think you have taken too much of this medicine contact a poison control center or emergency room at once. NOTE: This medicine is only for you. Do not share this medicine with others. What if I miss a dose? If you miss a dose, refer to the patient information sheet you received with your medicine for direction. If you miss more than one pill, this medicine may not be as effective and you may need to use another form of birth control. What may interact with this medicine? Do not take this medicine with any of the following medications: -  aminoglutethimide -amprenavir, fosamprenavir -atazanavir; cobicistat -anastrozole -bosentan -exemestane -letrozole -metyrapone -testolactone This medicine may also interact with the following medications: -acetaminophen -antiviral medicines for HIV or AIDS -aprepitant -barbiturates -certain antibiotics like rifampin, rifabutin, rifapentine, and possibly penicillins or tetracyclines -certain diuretics like amiloride, spironolactone, triamterene -certain medicines for fungal infections like griseofulvin, ketoconazole, itraconazole -certain medications for high blood pressure or heart conditions like ACE-inhibitors, Angiotensin-II receptor blockers, eplerenone -certain medicines for seizures like carbamazepine, oxcarbazepine, phenobarbital, phenytoin -cholestyramine -cobicistat -corticosteroid like hydrocortisone and prednisolone -cyclosporine -dantrolene -felbamate -grapefruit juice -heparin -lamotrigine -medicines for diabetes, including pioglitazone -modafinil -NSAIDs -potassium supplements -pyrimethamine -raloxifene -St. John's wort -sulfasalazine -tamoxifen -topiramate -thyroid hormones -warfarin his list may not  describe all possible interactions. Give your health care provider a list of all the medicines, herbs, non-prescription drugs, or dietary supplements you use. Also tell them if you smoke, drink alcohol, or use illegal drugs. Some items may interact with your medicine. This list may not describe all possible interactions. Give your health care provider a list of all the medicines, herbs, non-prescription drugs, or dietary supplements you use. Also tell them if you smoke, drink alcohol, or use illegal drugs. Some items may interact with your medicine. What should I watch for while using this medicine? Visit your doctor or health care professional for regular checks on your progress. You will need a regular breast and pelvic exam and Pap smear while on this medicine. Use an additional method of contraception during the first cycle that you take these tablets. If you have any reason to think you are pregnant, stop taking this medicine right away and contact your doctor or health care professional. If you are taking this medicine for hormone related problems, it may take several cycles of use to see improvement in your condition. Smoking increases the risk of getting a blood clot or having a stroke while you are taking birth control pills, especially if you are more than 25 years old. You are strongly advised not to smoke. This medicine can make your body retain fluid, making your fingers, hands, or ankles swell. Your blood pressure can go up. Contact your doctor or health care professional if you feel you are retaining fluid. This medicine can make you more sensitive to the sun. Keep out of the sun. If you cannot avoid being in the sun, wear protective clothing and use sunscreen. Do not use sun lamps or tanning beds/booths. If you wear contact lenses and notice visual changes, or if the lenses begin to feel uncomfortable, consult your eye care specialist. In some women, tenderness, swelling, or minor bleeding  of the gums may occur. Notify your dentist if this happens. Brushing and flossing your teeth regularly may help limit this. See your dentist regularly and inform your dentist of the medicines you are taking. If you are going to have elective surgery, you may need to stop taking this medicine before the surgery. Consult your health care professional for advice. This medicine does not protect you against HIV infection (AIDS) or any other sexually transmitted diseases. What side effects may I notice from receiving this medicine? Side effects that you should report to your doctor or health care professional as soon as possible: -allergic reactions like skin rash, itching or hives, swelling of the face, lips, or tongue -breast tissue changes or discharge -changes in vision -chest pain -confusion, trouble speaking or understanding -dark urine -general ill feeling or flu-like symptoms -light-colored stools -nausea, vomiting -pain, swelling, warmth in the leg -right upper belly  pain -severe headaches -shortness of breath -sudden numbness or weakness of the face, arm or leg -trouble walking, dizziness, loss of balance or coordination -unusual vaginal bleeding -yellowing of the eyes or skin Side effects that usually do not require medical attention (report to your doctor or health care professional if they continue or are bothersome): -acne -brown spots on the face -change in appetite -change in sexual desire -depressed mood or mood swings -fluid retention and swelling -stomach cramps or bloating -unusually weak or tired -weight gain This list may not describe all possible side effects. Call your doctor for medical advice about side effects. You may report side effects to FDA at 1-800-FDA-1088. Where should I keep my medicine? Keep out of the reach of children. Store at room temperature between 15 and 30 degrees C (59 and 86 degrees F). Throw away any unused medicine after the expiration  date. NOTE: This sheet is a summary. It may not cover all possible information. If you have questions about this medicine, talk to your doctor, pharmacist, or health care provider.  2018 Elsevier/Gold Standard (2016-02-29 13:52:56) Ethinyl Estradiol; Norgestimate tablets What is this medicine? ETHINYL ESTRADIOL; NORGESTIMATE (ETH in il es tra DYE ole; nor JES ti mate) is an oral contraceptive. The products combine two types of female hormones, an estrogen and a progestin. They are used to prevent ovulation and pregnancy. Some products are also used to treat acne in females. This medicine may be used for other purposes; ask your health care provider or pharmacist if you have questions. COMMON BRAND NAME(S): Estarylla, MONO-LINYAH, MonoNessa, Norgestimate/Ethinyl Estradiol, Ortho Tri-Cyclen, Ortho Tri-Cyclen Lo, Ortho-Cyclen, Previfem, Sprintec, Tri-Estarylla, TRI-LINYAH, Tri-Lo-Estarylla, Tri-Lo-Marzia, Tri-Lo-Sprintec, Tri-Previfem, Tri-Sprintec, Tri-VyLibra, Trinessa, Rachell Cipro What should I tell my health care provider before I take this medicine? They need to know if you have or ever had any of these conditions: -abnormal vaginal bleeding -blood vessel disease or blood clots -breast, cervical, endometrial, ovarian, liver, or uterine cancer -diabetes -gallbladder disease -heart disease or recent heart attack -high blood pressure -high cholesterol -kidney disease -liver disease -migraine headaches -stroke -systemic lupus erythematosus (SLE) -tobacco smoker -an unusual or allergic reaction to estrogens, progestins, other medicines, foods, dyes, or preservatives -pregnant or trying to get pregnant -breast-feeding How should I use this medicine? Take this medicine by mouth. To reduce nausea, this medicine may be taken with food. Follow the directions on the prescription label. Take this medicine at the same time each day and in the order directed on the package. Do not take your  medicine more often than directed. Contact your pediatrician regarding the use of this medicine in children. Special care may be needed. This medicine has been used in female children who have started having menstrual periods. A patient package insert for the product will be given with each prescription and refill. Read this sheet carefully each time. The sheet may change frequently. Overdosage: If you think you have taken too much of this medicine contact a poison control center or emergency room at once. NOTE: This medicine is only for you. Do not share this medicine with others. What if I miss a dose? If you miss a dose, refer to the patient information sheet you received with your medicine for direction. If you miss more than one pill, this medicine may not be as effective and you may need to use another form of birth control. What may interact with this medicine? Do not take this medicine with the following medication: -dasabuvir; ombitasvir; paritaprevir; ritonavir -ombitasvir;  paritaprevir; ritonavir This medicine may also interact with the following medications: -acetaminophen -antibiotics or medicines for infections, especially rifampin, rifabutin, rifapentine, and griseofulvin, and possibly penicillins or tetracyclines -aprepitant -ascorbic acid (vitamin C) -atorvastatin -barbiturate medicines, such as phenobarbital -bosentan -carbamazepine -caffeine -clofibrate -cyclosporine -dantrolene -doxercalciferol -felbamate -grapefruit juice -hydrocortisone -medicines for anxiety or sleeping problems, such as diazepam or temazepam -medicines for diabetes, including pioglitazone -mineral oil -modafinil -mycophenolate -nefazodone -oxcarbazepine -phenytoin -prednisolone -ritonavir or other medicines for HIV infection or AIDS -rosuvastatin -selegiline -soy isoflavones supplements -St. John's wort -tamoxifen or raloxifene -theophylline -thyroid  hormones -topiramate -warfarin This list may not describe all possible interactions. Give your health care provider a list of all the medicines, herbs, non-prescription drugs, or dietary supplements you use. Also tell them if you smoke, drink alcohol, or use illegal drugs. Some items may interact with your medicine. What should I watch for while using this medicine? Visit your doctor or health care professional for regular checks on your progress. You will need a regular breast and pelvic exam and Pap smear while on this medicine. You should also discuss the need for regular mammograms with your health care professional, and follow his or her guidelines for these tests. This medicine can make your body retain fluid, making your fingers, hands, or ankles swell. Your blood pressure can go up. Contact your doctor or health care professional if you feel you are retaining fluid. Use an additional method of contraception during the first cycle that you take these tablets. If you have any reason to think you are pregnant, stop taking this medicine right away and contact your doctor or health care professional. If you are taking this medicine for hormone related problems, it may take several cycles of use to see improvement in your condition. Do not use this product if you smoke and are over 66 years of age. Smoking increases the risk of getting a blood clot or having a stroke while you are taking birth control pills, especially if you are more than 25 years old. If you are a smoker who is 79 years of age or younger, you are strongly advised not to smoke while taking birth control pills. This medicine can make you more sensitive to the sun. Keep out of the sun. If you cannot avoid being in the sun, wear protective clothing and use sunscreen. Do not use sun lamps or tanning beds/booths. If you wear contact lenses and notice visual changes, or if the lenses begin to feel uncomfortable, consult your eye care  specialist. In some women, tenderness, swelling, or minor bleeding of the gums may occur. Notify your dentist if this happens. Brushing and flossing your teeth regularly may help limit this. See your dentist regularly and inform your dentist of the medicines you are taking. If you are going to have elective surgery, you may need to stop taking this medicine before the surgery. Consult your health care professional for advice. This medicine does not protect you against HIV infection (AIDS) or any other sexually transmitted diseases. What side effects may I notice from receiving this medicine? Side effects that you should report to your doctor or health care professional as soon as possible: -breast tissue changes or discharge -changes in vaginal bleeding during your period or between your periods -chest pain -coughing up blood -dizziness or fainting spells -headaches or migraines -leg, arm or groin pain -severe or sudden headaches -stomach pain (severe) -sudden shortness of breath -sudden loss of coordination, especially on one side of the body -  speech problems -symptoms of vaginal infection like itching, irritation or unusual discharge -tenderness in the upper abdomen -vomiting -weakness or numbness in the arms or legs, especially on one side of the body -yellowing of the eyes or skin Side effects that usually do not require medical attention (report to your doctor or health care professional if they continue or are bothersome): -breakthrough bleeding and spotting that continues beyond the 3 initial cycles of pills -breast tenderness -mood changes, anxiety, depression, frustration, anger, or emotional outbursts -increased sensitivity to sun or ultraviolet light -nausea -skin rash, acne, or brown spots on the skin -weight gain (slight) This list may not describe all possible side effects. Call your doctor for medical advice about side effects. You may report side effects to FDA at  1-800-FDA-1088. Where should I keep my medicine? Keep out of the reach of children. Store at room temperature between 15 and 30 degrees C (59 and 86 degrees F). Throw away any unused medicine after the expiration date. NOTE: This sheet is a summary. It may not cover all possible information. If you have questions about this medicine, talk to your doctor, pharmacist, or health care provider.  2018 Elsevier/Gold Standard (2016-02-18 08:09:09)

## 2017-11-02 NOTE — Progress Notes (Signed)
GYN ENCOUNTER NOTE  Subjective:       Danielle Schwartz is a 27 y.o. G0P0000 female here for results review. She was seen by myself on 10/19/2017 for her Annual Exam with concerns about AUB, acne, and increased facial hair.   Family history is significant for PCOS.   Denies difficulty breathing or respiratory distress, chest pain, abdominal pain, excessive vaginal bleeding, dysuria, and leg pain.    Gynecologic History  Patient's last menstrual period was 10/23/2017 (exact date).  Contraception: condoms  Last Pap: 09/2017. Results were: normal  Obstetric History  OB History  Gravida Para Term Preterm AB Living  0 0 0 0 0 0  SAB TAB Ectopic Multiple Live Births  0 0 0 0 0    Past Medical History:  Diagnosis Date  . Abnormal Pap smear of cervix    ascus/pos  . Acne   . Anxiety   . Heart murmur     Past Surgical History:  Procedure Laterality Date  . ETHMOIDECTOMY Bilateral 12/20/2015   Procedure: ENDOSCPIC ETHMOIDECTOMY TOTAL BILATERAL;  Surgeon: Margaretha Sheffield, MD;  Location: [place];  Service: ENT;  Laterality: Bilateral;  . FRONTAL SINUS EXPLORATION Bilateral 12/20/2015   Procedure:  BILATERAL;  Surgeon: Margaretha Sheffield, MD;  Location: [place];  Service: ENT;  Laterality: Bilateral;  . IMAGE GUIDED SINUS SURGERY N/A 12/20/2015   Procedure: IMAGE GUIDED SINUS SURGERY;  Surgeon: Margaretha Sheffield, MD;  Location: [place];  Service: ENT;  Laterality: N/A;  GAVE DISK TO [facility] 6/28 DEE  . MAXILLARY ANTROSTOMY Bilateral 12/20/2015   Procedure: ENDOSCOPIC MAXILLARY ANTROSTOMY BILATERAL;  Surgeon: Margaretha Sheffield, MD;  Location: [place];  Service: ENT;  Laterality: Bilateral;  . SEPTOPLASTY N/A 12/20/2015   Procedure: SEPTOPLASTY;  Surgeon: Margaretha Sheffield, MD;  Location: [place];  Service: ENT;  Laterality: N/A;  . WISDOM TOOTH EXTRACTION      Current Outpatient Medications on File Prior to Visit  Medication Sig Dispense Refill  .  nitrofurantoin, macrocrystal-monohydrate, (MACROBID) 100 MG capsule Take 1 capsule (100 mg total) by mouth at bedtime as needed. After intercourse to prevent infection. 30 capsule 1  . Triamcinolone Acetonide (NASACORT ALLERGY 24HR NA) Place into the nose daily.    . cholecalciferol (VITAMIN D) 1000 units tablet Take 1,000 Units by mouth daily.    . Probiotic Product (PROBIOTIC-10 PO) Take by mouth.    . vitamin B-12 (CYANOCOBALAMIN) 1000 MCG tablet Take 1,000 mcg by mouth daily.    . Zinc 100 MG TABS Take by mouth.     No current facility-administered medications on file prior to visit.     No Known Allergies  Social History   Socioeconomic History  . Marital status: Single    Spouse name: Not on file  . Number of children: Not on file  . Years of education: Not on file  . Highest education level: Not on file  Occupational History  . Not on file  Social Needs  . Financial resource strain: Not on file  . Food insecurity:    Worry: Not on file    Inability: Not on file  . Transportation needs:    Medical: Not on file    Non-medical: Not on file  Tobacco Use  . Smoking status: Former Smoker    Last attempt to quit: 07/20/2015    Years since quitting: 2.2  . Smokeless tobacco: Never Used  Substance and Sexual Activity  . Alcohol use: Yes  Alcohol/week: 3.6 oz    Types: 6 Glasses of wine per week    Comment: weekly  . Drug use: No  . Sexual activity: Yes    Partners: Male    Birth control/protection: Condom  Lifestyle  . Physical activity:    Days per week: Not on file    Minutes per session: Not on file  . Stress: Not on file  Relationships  . Social connections:    Talks on phone: Not on file    Gets together: Not on file    Attends religious service: Not on file    Active member of club or organization: Not on file    Attends meetings of clubs or organizations: Not on file    Relationship status: Not on file  . Intimate partner violence:    Fear of current or  ex partner: Not on file    Emotionally abused: Not on file    Physically abused: Not on file    Forced sexual activity: Not on file  Other Topics Concern  . Not on file  Social History Narrative  . Not on file    Family History  Problem Relation Age of Onset  . Hyperlipidemia Mother   . Migraines Mother   . Migraines Sister   . Hyperlipidemia Brother   . Hypertension Brother   . Cancer Maternal Grandfather        colon cancer  . Diabetes Maternal Grandfather   . Cancer Paternal Grandmother        skin cancer  . Cancer Paternal Grandfather        bladder cancer/colon cancer    The following portions of the patient's history were reviewed and updated as appropriate: allergies, current medications, past family history, past medical history, past social history, past surgical history and problem list.  Review of Systems  Review of Systems - Negative except as noted above.  History obtained from the patient  Objective:   BP 113/76   Pulse 63   Ht 5' 10"  (1.778 m)   Wt 151 lb 9 oz (68.7 kg)   LMP 10/23/2017 (Exact Date)   BMI 21.75 kg/m    CONSTITUTIONAL: Well-developed, well-nourished female in no acute distress.   ULTRASOUND REPORT  Location: [facility] Date of Service:  10/26/2017   Indications: Short menses; Family Hx PCOS Findings:  The uterus measures 7.6 x 4.1 x 3.4 cm. Echo texture is homogeneous without evidence of focal masses. The Endometrium measures 3.1 mm.  (Patient started menses on 10/23/17)  Right Ovary measures 3.2 x 2.5 x 1.7 cm and contains a dominant follicle measuring 1.2 x 1.3 x 1.3 cm. It is normal in appearance. Left Ovary measures 2.2 x 2.5 x 1.6 cm. It is normal appearance. Survey of the adnexa demonstrates no adnexal masses. There is trace free fluid in the cul de sac.  Impression: 1. Anteverted uterus appears of normal size and contour. 2. The endometrium measures 3.1 mm. (Patient started menses on 10/23/17) 3.  Bilateral ovaries appear WNL.  Right ovary contains a dominant follicle. 4. Trace free fluid noted in the posterior cul de sac.  Recommendations: 1.Clinical correlation with the patient's History and Physical Exam.  Recent Results (from the past 2160 hour(s))  Pap IG, rfx HPV ASCU,16/18     Status: None   Collection Time: 10/19/17  3:20 PM  Result Value Ref Range   DIAGNOSIS: Comment     Comment: NEGATIVE FOR INTRAEPITHELIAL LESION OR MALIGNANCY.   Specimen  adequacy: Comment     Comment: Satisfactory for evaluation. No endocervical component is identified. The absence of an endocervical component was confirmed by an additional screening evaluation.    Clinician Provided ICD10 Comment     Comment: Z12.4 Z01.419    Performed by: Comment     Comment: Heloise Ochoa, Cytotechnologist (ASCP)   QC reviewed by: Comment     Comment: Rene Kocher, Supervisory Cytotechnologist (ASCP)   PAP Smear Comment .    Note: Comment     Comment: The Pap smear is a screening test designed to aid in the detection of premalignant and malignant conditions of the uterine cervix.  It is not a diagnostic procedure and should not be used as the sole means of detecting cervical cancer.  Both false-positive and false-negative reports do occur.    Test Methodology Comment     Comment: This liquid based ThinPrep(R) pap test was screened with the use of an image guided system.    PAP Reflex Comment     Comment: The HPV DNA reflex criteria were not met with this specimen result therefore, no HPV testing was performed.   NuSwab Vaginitis Plus (VG+)     Status: None   Collection Time: 10/19/17  3:20 PM  Result Value Ref Range   Atopobium vaginae Low - 0 Score   BVAB 2 Low - 0 Score   Megasphaera 1 Low - 0 Score    Comment: Calculate total score by adding the 3 individual bacterial vaginosis (BV) marker scores together.  Total score is interpreted as follows: Total score 0-1: Indicates the absence  of BV. Total score   2: Indeterminate for BV. Additional clinical                  data should be evaluated to establish a                  diagnosis. Total score 3-6: Indicates the presence of BV. This test was developed and its performance characteristics determined by [facility].  It has not been cleared or approved by the Food and Drug Administration.  The FDA has determined that such clearance or approval is not necessary.    Candida albicans, NAA Negative Negative   Candida glabrata, NAA Negative Negative    Comment: This test was developed and its performance characteristics determined by [facility].  It has not been cleared or approved by the Food and Drug Administration.  The FDA has determined that such clearance or approval is not necessary.    Trich vag by NAA Negative Negative   Chlamydia trachomatis, NAA Negative Negative   Neisseria gonorrhoeae, NAA Negative Negative  FSH/LH     Status: None   Collection Time: 10/26/17  9:06 AM  Result Value Ref Range   LH 7.5 mIU/mL    Comment:                     Adult Female:                       Follicular phase      2.4 -  12.6                       Ovulation phase      14.0 -  95.6                       Luteal phase  1.0 -  11.4                       Postmenopausal        7.7 -  58.5    FSH 7.5 mIU/mL    Comment:                     Adult Female:                       Follicular phase      3.5 -  12.5                       Ovulation phase       4.7 -  21.5                       Luteal phase          1.7 -   7.7                       Postmenopausal       25.8 - 134.8   Testosterone, Free, Total, SHBG     Status: None   Collection Time: 10/26/17  9:06 AM  Result Value Ref Range   Testosterone 28 8 - 48 ng/dL   Testosterone, Free 2.5 0.0 - 4.2 pg/mL   Sex Hormone Binding 95.5 24.6 - 122.0 nmol/L  TSH     Status: None   Collection Time: 10/26/17  9:06 AM  Result Value Ref Range   TSH 2.090 0.450 - 4.500 uIU/mL   Prolactin     Status: Abnormal   Collection Time: 10/26/17  9:06 AM  Result Value Ref Range   Prolactin 23.7 (H) 4.8 - 23.3 ng/mL  DHEA-sulfate     Status: None   Collection Time: 10/26/17  9:06 AM  Result Value Ref Range   DHEA-SO4 220.1 84.8 - 378.0 ug/dL  Comprehensive metabolic panel     Status: None   Collection Time: 10/26/17  9:06 AM  Result Value Ref Range   Glucose 75 65 - 99 mg/dL   BUN 11 6 - 20 mg/dL   Creatinine, Ser 0.82 0.57 - 1.00 mg/dL   GFR calc non Af Amer 100 >59 mL/min/1.73   GFR calc Af Amer 115 >59 mL/min/1.73   BUN/Creatinine Ratio 13 9 - 23   Sodium 141 134 - 144 mmol/L   Potassium 4.4 3.5 - 5.2 mmol/L   Chloride 102 96 - 106 mmol/L   CO2 22 20 - 29 mmol/L   Calcium 9.5 8.7 - 10.2 mg/dL   Total Protein 6.8 6.0 - 8.5 g/dL   Albumin 4.7 3.5 - 5.5 g/dL   Globulin, Total 2.1 1.5 - 4.5 g/dL   Albumin/Globulin Ratio 2.2 1.2 - 2.2   Bilirubin Total 0.7 0.0 - 1.2 mg/dL   Alkaline Phosphatase 54 39 - 117 IU/L   AST 14 0 - 40 IU/L   ALT 12 0 - 32 IU/L  CBC     Status: None   Collection Time: 10/26/17  9:06 AM  Result Value Ref Range   WBC 5.2 3.4 - 10.8 x10E3/uL   RBC 4.11 3.77 - 5.28 x10E6/uL   Hemoglobin 12.6 11.1 - 15.9 g/dL   Hematocrit 39.5 34.0 - 46.6 %   MCV 96 79 - 97 fL   MCH 30.7 26.6 - 33.0 pg  MCHC 31.9 31.5 - 35.7 g/dL   RDW 15.4 12.3 - 15.4 %   Platelets 323 150 - 379 x10E3/uL    Comment:                **Effective Nov 09, 2017 the reference interval for**                  Platelets will be changing to:                                0 - 7 d            140 - 396 x10E3/uL                                8 - 30 d           139 - 531 x10E3/uL                               31 d - 999 yrs      150 - 450 x10E3/uL        Assessment:   1. Abnormal uterine bleeding   2. Acne, unspecified acne type   3. Family history of PCOS   4. Excessive hairiness   5. Follow up   Plan:   Discussed treatment options including OCPs, IUDs,  Nexplanon and topical creams; handouts given.   Patient would like to try Aldactone for excessive facial hair. Will contact if desires additional treatment.   Rx: Aldactone, see orders.   Reviewed red symptoms and when to call.   RTC x 3 moths for labs or sooner if needed.    Diona Fanti, CNM [facility], [facility]

## 2017-11-02 NOTE — Progress Notes (Signed)
Pt is here to review test results. 

## 2017-11-03 ENCOUNTER — Encounter: Payer: Self-pay | Admitting: Certified Nurse Midwife

## 2017-11-03 ENCOUNTER — Other Ambulatory Visit: Payer: Self-pay

## 2017-11-04 ENCOUNTER — Other Ambulatory Visit: Payer: Self-pay | Admitting: Certified Nurse Midwife

## 2017-11-04 MED ORDER — NORGESTIM-ETH ESTRAD TRIPHASIC 0.18/0.215/0.25 MG-25 MCG PO TABS: 1.0000 | ORAL_TABLET | Freq: Every day | ORAL | 11 refills | Status: DC

## 2017-11-04 MED ORDER — DOXYCYCLINE HYCLATE 50 MG PO CAPS: 50.0000 mg | ORAL_CAPSULE | Freq: Two times a day (BID) | ORAL | 2 refills | Status: DC

## 2017-11-04 NOTE — Progress Notes (Signed)
Orders placed for ortho tri cyclen and doxycycline to treat acne.   Doreene Burke, CNM

## 2017-11-05 ENCOUNTER — Other Ambulatory Visit: Payer: Self-pay

## 2017-11-30 DIAGNOSIS — F4323 Adjustment disorder with mixed anxiety and depressed mood: Secondary | ICD-10-CM | POA: Diagnosis not present

## 2017-12-08 ENCOUNTER — Encounter: Payer: Self-pay | Admitting: Certified Nurse Midwife

## 2017-12-21 ENCOUNTER — Encounter: Payer: BLUE CROSS/BLUE SHIELD | Admitting: Certified Nurse Midwife

## 2017-12-22 ENCOUNTER — Ambulatory Visit (INDEPENDENT_AMBULATORY_CARE_PROVIDER_SITE_OTHER): Payer: BLUE CROSS/BLUE SHIELD | Admitting: Certified Nurse Midwife

## 2017-12-22 DIAGNOSIS — Z5181 Encounter for therapeutic drug level monitoring: Secondary | ICD-10-CM

## 2017-12-22 DIAGNOSIS — L689 Hypertrichosis, unspecified: Secondary | ICD-10-CM

## 2017-12-22 DIAGNOSIS — Z09 Encounter for follow-up examination after completed treatment for conditions other than malignant neoplasm: Secondary | ICD-10-CM

## 2017-12-22 NOTE — Patient Instructions (Signed)
Diet for Polycystic Ovarian Syndrome Polycystic ovary syndrome (PCOS) is a disorder of the chemical messengers (hormones) that regulate menstruation. The condition causes important hormones to be out of balance. PCOS can:  Make your periods irregular or stop.  Cause cysts to develop on the ovaries.  Make it difficult to get pregnant.  Stop your body from responding to the effects of insulin (insulin resistance), which can lead to obesity and diabetes.  Changing what you eat can help manage PCOS and improve your health. It can help you lose weight and improve the way your body uses insulin. What is my plan?  Eat breakfast, lunch, and dinner plus two snacks every day.  Include protein in each meal and snack.  Choose whole grains instead of products made with refined flour.  Eat a variety of foods.  Exercise regularly as told by your health care provider. What do I need to know about this eating plan? If you are overweight or obese, pay attention to how many calories you eat. Cutting down on calories can help you lose weight. Work with your health care provider or dietitian to figure out how many calories you need each day. What foods can I eat? Grains Whole grains, such as whole wheat. Whole-grain breads, crackers, cereals, and pasta. Unsweetened oatmeal, bulgur, barley, quinoa, or brown rice. Corn or whole-wheat flour tortillas. Vegetables  Lettuce. Spinach. Peas. Beets. Cauliflower. Cabbage. Broccoli. Carrots. Tomatoes. Squash. Eggplant. Herbs. Peppers. Onions. Cucumbers. Brussels sprouts. Fruits Berries. Bananas. Apples. Oranges. Grapes. Papaya. Mango. Pomegranate. Kiwi. Grapefruit. Cherries. Meats and Other Protein Sources Lean proteins, such as fish, chicken, beans, eggs, and tofu. Dairy Low-fat dairy products, such as skim milk, cheese sticks, and yogurt. Beverages Low-fat or fat-free drinks, such as water, low-fat milk, sugar-free drinks, and 100% fruit  juice. Condiments Ketchup. Mustard. Barbecue sauce. Relish. Low-fat or fat-free mayonnaise. Fats and Oils Olive oil or canola oil. Walnuts and almonds. The items listed above may not be a complete list of recommended foods or beverages. Contact your dietitian for more options. What foods are not recommended? Foods high in calories or fat. Fried foods. Sweets. Products made from refined white flour, including white bread, pastries, white rice, and pasta. The items listed above may not be a complete list of foods and beverages to avoid. Contact your dietitian for more information. This information is not intended to replace advice given to you by your health care provider. Make sure you discuss any questions you have with your health care provider. Document Released: 10/01/2015 Document Revised: 11/15/2015 Document Reviewed: 06/21/2014 Elsevier Interactive Patient Education  2018 [employer]. Spironolactone tablets What is this medicine? SPIRONOLACTONE ([place] LAK tone) is a diuretic. It helps you make more urine and to lose excess water from your body. This medicine is used to treat high blood pressure, and edema or swelling from heart, kidney, or liver disease. It is also used to treat patients who make too much aldosterone or have low potassium. This medicine may be used for other purposes; ask your health care provider or pharmacist if you have questions. COMMON BRAND NAME(S): Aldactone What should I tell my health care provider before I take this medicine? They need to know if you have any of these conditions: -high blood level of potassium -kidney disease or trouble making urine -liver disease -an unusual or allergic reaction to spironolactone, other medicines, foods, dyes, or preservatives -pregnant or trying to get pregnant -breast-feeding How should I use this medicine? Take this medicine by  mouth with a drink of water. Follow the directions on your prescription label. You can  take it with or without food. If it upsets your stomach, take it with food. Do not take your medicine more often than directed. Remember that you will need to pass more urine after taking this medicine. Do not take your doses at a time of day that will cause you problems. Do not take at bedtime. Talk to your pediatrician regarding the use of this medicine in children. While this drug may be prescribed for selected conditions, precautions do apply. Overdosage: If you think you have taken too much of this medicine contact a poison control center or emergency room at once. NOTE: This medicine is only for you. Do not share this medicine with others. What if I miss a dose? If you miss a dose, take it as soon as you can. If it is almost time for your next dose, take only that dose. Do not take double or extra doses. What may interact with this medicine? Do not take this medicine with any of the following medications: -eplerenone This medicine may also interact with the following medications: -corticosteroids -digoxin -lithium -medicines for high blood pressure like ACE inhibitors -skeletal muscle relaxants like tubocurarine -NSAIDs, medicines for pain and inflammation, like ibuprofen or naproxen -potassium products like salt substitute or supplements -pressor amines like norepinephrine -some diuretics This list may not describe all possible interactions. Give your health care provider a list of all the medicines, herbs, non-prescription drugs, or dietary supplements you use. Also tell them if you smoke, drink alcohol, or use illegal drugs. Some items may interact with your medicine. What should I watch for while using this medicine? Visit your doctor or health care professional for regular checks on your progress. Check your blood pressure as directed. Ask your doctor what your blood pressure should be, and when you should contact them. You may need to be on a special diet while taking this medicine.  Ask your doctor. Also, ask how many glasses of fluid you need to drink a day. You must not get dehydrated. This medicine may make you feel confused, dizzy or lightheaded. Drinking alcohol and taking some medicines can make this worse. Do not drive, use machinery, or do anything that needs mental alertness until you know how this medicine affects you. Do not sit or stand up quickly. What side effects may I notice from receiving this medicine? Side effects that you should report to your doctor or health care professional as soon as possible: -allergic reactions such as skin rash or itching, hives, swelling of the lips, mouth, tongue, or throat -black or tarry stools -fast, irregular heartbeat -fever -muscle pain, cramps -numbness, tingling in hands or feet -trouble breathing -trouble passing urine -unusual bleeding -unusually weak or tired Side effects that usually do not require medical attention (report to your doctor or health care professional if they continue or are bothersome): -change in voice or hair growth -confusion -dizzy, drowsy -dry mouth, increased thirst -enlarged or tender breasts -headache -irregular menstrual periods -sexual difficulty, unable to have an erection -stomach upset This list may not describe all possible side effects. Call your doctor for medical advice about side effects. You may report side effects to FDA at 1-800-FDA-1088. Where should I keep my medicine? Keep out of the reach of children. Store below 25 degrees C (77 degrees F). Throw away any unused medicine after the expiration date. NOTE: This sheet is a summary. It may not cover  all possible information. If you have questions about this medicine, talk to your doctor, pharmacist, or health care provider.  2018 Elsevier/Gold Standard (2010-02-19 12:51:30) Comprehensive Metabolic Panel The comprehensive metabolic panel (CMP) measures levels of the following substances in your blood:  Glucose.  Glucose is a simple sugar that serves as the main source of energy for your body.  Creatinine. Creatinine is a waste product of normal muscle activity. It is excreted from the body by the kidneys.  Blood urea nitrogen (BUN). Urea nitrogen is a waste product of protein breakdown. It is produced when excess protein in your body is broken down and used for energy. It is excreted by the kidneys.  Electrolytes. Electrolytes are negatively or positively charged particles that are dissolved in the water of different body compartments. This includes the serum portion of blood, water inside cells, and water outside cells. Concentrations of electrolytes vary among the different fluid compartments. Electrolytes are tightly regulated to maintain a salt-water and acid-base balance in the body. The electrolytes include: ? Potassium. ? Sodium. ? Chloride. ? Calcium. ? Bicarbonate.  Alkaline phosphatase (ALP). This is a protein found in all tissues of your body. The bones, liver, and bile ducts have the highest amounts.  Alanine aminotransferase (ALT). ALT is an enzyme found throughout the body. It is present in the highest concentrations in the tissues of the liver. If your liver is injured, there will also be ALT in your bloodstream.  Aspartate aminotransferase (AST). This is another enzyme found mostly in your liver. There is also a high concentration in your muscle cells and heart. Testing for AST is helpful to check for liver damage.  Bilirubin. As the liver breaks down red blood cells, it produces a waste product called bilirubin. A high level of bilirubin can indicate certain health problems.  Albumin. This is a protein that is a major component of the liquid part of blood (serum). It is made by the liver and can be measured in the bloodstream.  Total protein. This is a measurement of the amount of the two types of protein found in blood serum. The two proteins are albumin and globulins. Globulins are  part of your immune system.  A comprehensive metabolic panel requires a blood sample taken from a vein in your arm or hand. How do I prepare for this test? Your health care provider may ask you not to eat or drink anything for 6-8 hours before your blood sample is taken. What do the results mean? It is your responsibility to obtain your test results. Ask the lab or department performing the test when and how you will get your results. Contact your health care provider to discuss any questions you have about your results. RANGE OF NORMAL VALUES Ranges for normal values may vary among different labs and hospitals. You should always check with your health care provider after having lab work or other tests done to discuss whether your values are considered within normal limits. The following are normal ranges for each part of a CMP: Glucose  Cord: 45-96 mg/dL or 2.5-5.3 mmol/L (SI units).  Premature infant: 20-60 mg/dL or 1.1-3.3 mmol/L.  Neonate: 30-60 mg/dL or 1.7-3.3 mmol/L.  Infant: 40-90 mg/dL or 2.2-5.0 mmol/L.  Child under 85 years old: 60-100 mg/dL or 3.3-5.5 mmol/L.  Adult or child over 81 years old: ? Fasting: 70-110 mg/dL or less than 6.1 mmol/L. ? Random (nonfasting or casual): less than or equal to 200 mg/dL or less than 11.1 mmol/L.  Elderly:  increase in normal range after age 74 years. Creatinine  Child under 50 years old: 0.1-0.4 mg/dL.  Child 68-25 years old: 0.2-0.5 mg/dL.  Child 83-48 years old: 0.3-0.6 mg/dL.  Child or adolescent 69-1 years old: 0.4-1.0 mg/dL.  Adult 48-48 years old: ? Female: 0.5-1.0 mg/dL. ? Female: 0.6-1.2 mg/dL.  Adult 62-78 years old: ? Female: 0.5-1.1 mg/dL. ? Female: 0.6-1.3 mg/dL.  Adult 38 years old and above: ? Female: 0.5-1.2 mg/dL. ? Female: 0.7-1.3 mg/dL. BUN  Cord: 21-40 mg/dL.  Newborn: 3-12 mg/dL.  Infant: 5-18 mg/dL.  Child: 5-18 mg/dL.  Adult: 10-20 mg/dL or 3.6-7.1 mmol/L (SI units).  Elderly: may be slightly higher  than adult. Potassium  Newborn: 3.9-5.9 mEq/L.  Infant: 4.1-5.3 mEq/L.  Child: 3.4-4.7 mEq/L.  Adult or elderly: 3.5-5.0 mEq/L or 3.5-5.0 mmol/L (SI units). Sodium  Newborn: 134-144 mEq/L.  Infant: 134-150 mEq/L.  Child: 136-145 mEq/L.  Adult or elderly: 136-145 mEq/L or 136-145 mmol/L (SI units). Chloride  Premature infant: 95-110 mEq/L.  Newborn: 96-106 mEq/L.  Child: 90-110 mEq/L.  Adult or elderly: 98-106 mEq/L or 98-106 mmol/L (SI units). Calcium  Total calcium: ? Newborn under 65 days old: 7.6-10.4 mg/dL or 1.9-2.60 mmol/L. ? Umbilical: 8.0-03.4 mg/dL or 2.25-2.88 mmol/L. ? 77 days to 25 years old: 9.0-10.6 mg/dL or 2.3-2.65 mmol/L. ? Child: 8.8-10.8 mg/dL or 2.2-2.7 mmol/L. ? Adult: 9.0-10.5 mg/dL or 2.25-2.62 mmol/L.  Ionized calcium: ? Newborn: 4.20-5.58 mg/dL or 1.05-1.37 mmol/L. ? 2 months to 25 years old: 4.80-5.52 mg/dL or 1.20-1.38 mmol/L. ? Adult: 4.5-5.6 mg/dL or 1.05-1.30 mmol/L. Bicarbonate  Newborn: 13-22 mEq/L.  Infant: 20-28 mEq/L.  Child: 20-28 mEq/L.  Adult or elderly: 23-30 mEq/L or 23-30 mmol/L (SI units). ALP  Child under 58 years old: 85-235 units/L.  70-25 years old: 65-210 units/L.  51-61 years old: 60-300 units/L.  85-84 years old: 30-200 units/L.  Adult: 30-120 units/L or 0.5-2.0 microkatal/L (SI units).  Elderly: slightly higher than adult. ALT  Infant: may be twice as high as adult values.  Child or adult: 4-36 international units/L at 98.73F (37C) or 4-36 units/L (SI units).  Elderly: may be slightly higher than adult values. AST  Newborn 64-76 days old: 35-140 units/L.  Child under 66 years old: 15-60 units/L.  60-63 years old: 15-50 units/L.  51-71 years old: 10-50 units/L.  17-66 years old: 10-40 units/L.  Adult: 0-35 units/L or 0-0.58 microkatal/L (SI units).  Elderly: slightly higher than adults. Bilirubin  Total bilirubin for newborn: 1.0-12.0 mg/dL or 17.1-205 micromoles/L (SI units).  Adult,  elderly, or child: ? Total bilirubin: 0.3-1.0 mg/dL or 5.1-17 micromoles/L. ? Indirect bilirubin: 0.2-0.8 mg/dL or 3.4-12.0 micromoles/L. ? Direct bilirubin: 0.1-0.3 mg/dL or 1.7-5.1 micromoles/L. Albumin  Premature infant: 3.0-4.2 g/dL.  Newborn: 3.5-5.4 g/dL.  Infant: 4.4-5.4 g/dL.  Child: 4.0-5.9 g/dL.  Adult or elderly: 3.5-5.0 g/dL or 35-50 g/L (SI units). Total protein  Premature infant: 4.2-7.6 g/dL.  Newborn: 4.6-7.4 g/dL.  Infant: 6.0-6.7 g/dL.  Child: 6.2-8.0 g/dL.  Adult or elderly: 6.4-8.3 g/dL or 64-83 g/L (SI units). MEANING OF RESULTS OUTSIDE OF NORMAL VALUE RANGES Diet and levels of activity can have an effect on your test results. Sometimes they can be the cause of values that are outside of normal limits. However, sometimes values outside normal limits can indicate a medical disorder: Glucose Abnormally high glucose levels (hyperglycemia) are usually associated with prediabetes mellitus and diabetes mellitus. They can also occur with severe stress on the body. This stress can come from surgery or events such as stroke  or trauma. Overactive thyroid gland and pancreatitis or pancreatic cancer can also cause abnormally high glucose levels. Abnormally low glucose levels (hypoglycemia) can occur with underactive thyroid gland and rare insulin-secreting tumors (insulinoma). Creatinine Abnormally high creatinine levels are most commonly seen in kidney failure. They can also be seen with overactive thyroid (hyperthyroidism), conditions related to overgrowth of the body (acromegaly or gigantism), abnormal breakdown of muscle tissue (rhabdomyolysis), and early muscular dystrophy. Abnormally low creatinine levels can indicate low muscle mass associated with malnutrition or late-stage muscular dystrophy. BUN Abnormally high BUN levels, especially greater than 50 mg/dL, generally mean that your kidneys are not functioning normally. Abnormally low BUN levels can be seen with  malnutrition and liver failure. Potassium Abnormally high potassium levels (hyperkalemia) are most often seen with kidney disease, massive destruction of red blood cells (hemolysis), and adrenal gland failure (Addison disease). Abnormally low potassium levels (hypokalemia) are seen with excessive levels of the hormone aldosterone (hyperaldosteronism). Sodium Abnormally high sodium levels (hypernatremia) can be seen with dehydration, excessive thirst, and urination due to abnormally low levels of antidiuretic hormone (diabetes insipidus). They can also be seen with hyperaldosteronism and excessive levels of cortisol in the body (Cushing syndrome). Abnormally low levels of sodium (hyponatremia) can be seen with congestive heart failure, cirrhosis of the liver, kidney failure, and the syndrome of inappropriate antidiuretic hormone (SIADH). Chloride Abnormally high levels of chloride (hyperchloremia) can be seen with acute kidney failure, diabetes insipidus, prolonged diarrhea, and poisoning with aspirin or bromide. Abnormally low levels of chloride (hypochloremia) can be seen with prolonged vomiting, acute adrenal gland failure (addisonian crisis), hyperaldosteronism, and SIADH. Calcium Abnormally high levels of calcium (hypercalcemia) can occur with excessive activity of the parathyroid glands (hyperparathyroidism), certain cancers, and a type of inflammation seen in sarcoidosis and tuberculosis. Abnormally low levels of calcium (hypocalcemia) can be seen with underactive parathyroid glands (hypoparathyroidism), vitamin D deficiency, and acute pancreatitis. Bicarbonate Abnormally high bicarbonate levels are seen after prolonged vomiting and diuretic therapy, which lead to a decrease in the amount of acid in the body (metabolic alkalosis). They can also be seen in conditions that increase the amount of bicarbonate in the body. These conditions include hyperaldosteronism and rare hereditary disorders that  interfere with how your kidneys handle electrolytes, such as Bartter syndrome. Abnormally low bicarbonate levels are seen with conditions that cause your body to produce too much acid (metabolic acidosis). These conditions include uncontrolled diabetes mellitus and poisoning with aspirin, methanol, or antifreeze (ethylene glycol). ALP An abnormally high level of ALP can be a sign of certain cancers or tumors, liver disease, hepatitis, sarcoidosis, rickets, a blocked bile duct, or bone problems such as a fracture. An abnormally low level of ALP can be caused by malnutrition, hypophosphatasia, or Wilson disease. ALT An abnormally high level of ALT can indicate mononucleosis, pancreatitis, or liver problems. The liver problems include hepatitis, cirrhosis, and liver cancer. AST An abnormally high level of AST can indicate some of the same liver problems as a high level of ALT. It is also related to conditions such as mononucleosis, pancreatitis, and muscle trauma. Bilirubin An abnormally high level of bilirubin can result from liver problems such as cirrhosis, liver disease, and hepatitis. It can also result from problems with the bile ducts, pancreas, or gallbladder. Albumin An abnormally high level of albumin might be a sign of dehydration or of eating a high-protein diet. You can have a low level of albumin if you eat a low-protein diet or have had weight-loss surgery. An  abnormally low level of albumin can also be a sign of a more serious health issue, including liver disease, kidney disease, or Crohn disease. Total protein An abnormally high total protein level often indicates an infection (such as hepatitis B or C or HIV), multiple myeloma, or Waldenstrom disease. An abnormally low level of total protein is seen in such conditions as malnutrition, severe burns, heavy bleeding, and liver disease. Talk with your health care provider to discuss your results, treatment options, and if necessary, the need  for more tests. Talk with your health care provider if you have any questions about your results. This information is not intended to replace advice given to you by your health care provider. Make sure you discuss any questions you have with your health care provider. Document Released: 07/02/2004 Document Revised: 02/12/2016 Document Reviewed: 10/05/2013 Elsevier Interactive Patient Education  [employer].

## 2017-12-22 NOTE — Progress Notes (Signed)
GYN ENCOUNTER NOTE  Subjective:       Danielle Schwartz is a 27 y.o. G0P0000 female here for medication follow up and lab draw. Start Aldactone and Doxycycline in May 2019 for management of PCOS symptoms. Has not started OCP.   Reports reduced facial hair and clearing skin.   Gynecologic History  Patient's last menstrual period was 12/13/2017 (exact date).  Contraception: condoms  Last Pap: 09/2017. Results were: normal  Obstetric History  OB History  Gravida Para Term Preterm AB Living  0 0 0 0 0 0  SAB TAB Ectopic Multiple Live Births  0 0 0 0 0    Past Medical History:  Diagnosis Date  . Abnormal Pap smear of cervix    ascus/pos  . Acne   . Anxiety   . Heart murmur     Past Surgical History:  Procedure Laterality Date  . ETHMOIDECTOMY Bilateral 12/20/2015   Procedure: ENDOSCPIC ETHMOIDECTOMY TOTAL BILATERAL;  Surgeon: Vernie Murders, MD;  Location: [facility];  Service: ENT;  Laterality: Bilateral;  . FRONTAL SINUS EXPLORATION Bilateral 12/20/2015   Procedure:  BILATERAL;  Surgeon: Vernie Murders, MD;  Location: [facility];  Service: ENT;  Laterality: Bilateral;  . IMAGE GUIDED SINUS SURGERY N/A 12/20/2015   Procedure: IMAGE GUIDED SINUS SURGERY;  Surgeon: Vernie Murders, MD;  Location: [facility];  Service: ENT;  Laterality: N/A;  GAVE DISK TO CECE 6/28 DEE  . MAXILLARY ANTROSTOMY Bilateral 12/20/2015   Procedure: ENDOSCOPIC MAXILLARY ANTROSTOMY BILATERAL;  Surgeon: Vernie Murders, MD;  Location: [facility];  Service: ENT;  Laterality: Bilateral;  . SEPTOPLASTY N/A 12/20/2015   Procedure: SEPTOPLASTY;  Surgeon: Vernie Murders, MD;  Location: [facility];  Service: ENT;  Laterality: N/A;  . WISDOM TOOTH EXTRACTION      Current Outpatient Medications on File Prior to Visit  Medication Sig Dispense Refill  . Diindolylmethane POWD by Does not apply route.    . doxycycline (VIBRAMYCIN) 50 MG capsule Take 1 capsule (50 mg total) by mouth 2  (two) times daily. 30 capsule 2  . nitrofurantoin, macrocrystal-monohydrate, (MACROBID) 100 MG capsule Take 1 capsule (100 mg total) by mouth at bedtime as needed. After intercourse to prevent infection. 30 capsule 1  . Probiotic Product (PROBIOTIC-10 PO) Take by mouth.    . spironolactone (ALDACTONE) 50 MG tablet Take 1 tablet (50 mg total) by mouth daily. 30 tablet 2  . Triamcinolone Acetonide (NASACORT ALLERGY 24HR NA) Place into the nose daily.    . vitamin B-12 (CYANOCOBALAMIN) 1000 MCG tablet Take 1,000 mcg by mouth daily.    . cholecalciferol (VITAMIN D) 1000 units tablet Take 1,000 Units by mouth daily.    . Norgestimate-Ethinyl Estradiol Triphasic 0.18/0.215/0.25 MG-25 MCG tab Take 1 tablet by mouth daily. (Patient not taking: Reported on 12/22/2017) 1 Package 11  . Zinc 100 MG TABS Take by mouth.     No current facility-administered medications on file prior to visit.     No Known Allergies  Social History   Socioeconomic History  . Marital status: Single    Spouse name: Not on file  . Number of children: Not on file  . Years of education: Not on file  . Highest education level: Not on file  Occupational History  . Not on file  Social Needs  . Financial resource strain: Not on file  . Food insecurity:    Worry: Not on file    Inability: Not on file  . Transportation needs:  Medical: Not on file    Non-medical: Not on file  Tobacco Use  . Smoking status: Former Smoker    Last attempt to quit: 07/20/2015    Years since quitting: 2.4  . Smokeless tobacco: Never Used  Substance and Sexual Activity  . Alcohol use: Yes    Alcohol/week: 3.6 oz    Types: 6 Glasses of wine per week    Comment: weekly  . Drug use: No  . Sexual activity: Yes    Partners: Male    Birth control/protection: Condom  Lifestyle  . Physical activity:    Days per week: Not on file    Minutes per session: Not on file  . Stress: Not on file  Relationships  . Social connections:    Talks on  phone: Not on file    Gets together: Not on file    Attends religious service: Not on file    Active member of club or organization: Not on file    Attends meetings of clubs or organizations: Not on file    Relationship status: Not on file  . Intimate partner violence:    Fear of current or ex partner: Not on file    Emotionally abused: Not on file    Physically abused: Not on file    Forced sexual activity: Not on file  Other Topics Concern  . Not on file  Social History Narrative  . Not on file    Family History  Problem Relation Age of Onset  . Hyperlipidemia Mother   . Migraines Mother   . Migraines Sister   . Hyperlipidemia Brother   . Hypertension Brother   . Cancer Maternal Grandfather        colon cancer  . Diabetes Maternal Grandfather   . Cancer Paternal Grandmother        skin cancer  . Cancer Paternal Grandfather        bladder cancer/colon cancer    The following portions of the patient's history were reviewed and updated as appropriate: allergies, current medications, past family history, past medical history, past social history, past surgical history and problem list.  Review of Systems  ROS negative except as noted above. Information obtained from patient.   Objective:   BP 119/80   Pulse 73   Ht 5\' 10"  (1.778 m)   Wt 151 lb 5 oz (68.6 kg)   LMP 12/13/2017 (Exact Date)   BMI 21.71 kg/m   GENERAL: Alert and oriented x 4, no apparent distress.   Physical exam: not indicated.   Assessment:   1. Excessive hairiness  - Comprehensive metabolic panel  2. Follow up  - Comprehensive metabolic panel  3. Medication monitoring encounter  - Comprehensive metabolic panel   Plan:   Labs: CMP today, will contact patient with results and increased Aldactone as needed.   Reviewed red flag symptoms and when to call.   RTC x 3 months for medication check or sooner if needed.    Gunnar BullaJenkins Michelle Rukiya Hodgkins, CNM [facility], [facility]

## 2017-12-22 NOTE — Progress Notes (Signed)
Pt is here for a followup on meds.

## 2017-12-23 LAB — COMPREHENSIVE METABOLIC PANEL
ALBUMIN: 4.6 g/dL (ref 3.5–5.5)
ALT: 12 IU/L (ref 0–32)
AST: 18 IU/L (ref 0–40)
Albumin/Globulin Ratio: 2 (ref 1.2–2.2)
Alkaline Phosphatase: 61 IU/L (ref 39–117)
BUN / CREAT RATIO: 19 (ref 9–23)
BUN: 14 mg/dL (ref 6–20)
Bilirubin Total: 0.6 mg/dL (ref 0.0–1.2)
CALCIUM: 9.8 mg/dL (ref 8.7–10.2)
CHLORIDE: 104 mmol/L (ref 96–106)
CO2: 23 mmol/L (ref 20–29)
CREATININE: 0.72 mg/dL (ref 0.57–1.00)
GFR calc Af Amer: 135 mL/min/{1.73_m2} (ref >59)
GFR, EST NON AFRICAN AMERICAN: 117 mL/min/{1.73_m2} (ref >59)
GLOBULIN, TOTAL: 2.3 g/dL (ref 1.5–4.5)
Glucose: 75 mg/dL (ref 65–99)
POTASSIUM: 4.7 mmol/L (ref 3.5–5.2)
Sodium: 141 mmol/L (ref 134–144)
Total Protein: 6.9 g/dL (ref 6.0–8.5)

## 2017-12-28 ENCOUNTER — Other Ambulatory Visit: Payer: Self-pay | Admitting: Certified Nurse Midwife

## 2017-12-28 MED ORDER — SPIRONOLACTONE 50 MG PO TABS: 50.0000 mg | ORAL_TABLET | Freq: Two times a day (BID) | ORAL | 2 refills | Status: DC

## 2018-02-12 DIAGNOSIS — J019 Acute sinusitis, unspecified: Secondary | ICD-10-CM | POA: Diagnosis not present

## 2018-02-12 DIAGNOSIS — B9689 Other specified bacterial agents as the cause of diseases classified elsewhere: Secondary | ICD-10-CM | POA: Diagnosis not present

## 2018-03-02 DIAGNOSIS — J301 Allergic rhinitis due to pollen: Secondary | ICD-10-CM | POA: Diagnosis not present

## 2018-03-02 DIAGNOSIS — J328 Other chronic sinusitis: Secondary | ICD-10-CM | POA: Diagnosis not present

## 2018-03-26 ENCOUNTER — Encounter: Payer: BLUE CROSS/BLUE SHIELD | Admitting: Certified Nurse Midwife

## 2018-03-29 ENCOUNTER — Ambulatory Visit: Payer: BLUE CROSS/BLUE SHIELD | Admitting: Certified Nurse Midwife

## 2018-03-29 ENCOUNTER — Encounter: Payer: Self-pay | Admitting: Certified Nurse Midwife

## 2018-03-29 VITALS — BP 127/76 | HR 52 | Ht 70.0 in | Wt 157.0 lb

## 2018-03-29 DIAGNOSIS — L689 Hypertrichosis, unspecified: Secondary | ICD-10-CM | POA: Diagnosis not present

## 2018-03-29 DIAGNOSIS — E282 Polycystic ovarian syndrome: Secondary | ICD-10-CM | POA: Diagnosis not present

## 2018-03-29 DIAGNOSIS — N6452 Nipple discharge: Secondary | ICD-10-CM | POA: Diagnosis not present

## 2018-03-29 DIAGNOSIS — Z79899 Other long term (current) drug therapy: Secondary | ICD-10-CM

## 2018-03-29 MED ORDER — SPIRONOLACTONE 100 MG PO TABS: 100.0000 mg | ORAL_TABLET | Freq: Two times a day (BID) | ORAL | 6 refills | Status: DC

## 2018-03-29 MED ORDER — SPIRONOLACTONE 50 MG PO TABS: 50.0000 mg | ORAL_TABLET | Freq: Two times a day (BID) | ORAL | 6 refills | Status: DC

## 2018-03-29 MED ORDER — NITROFURANTOIN MONOHYD MACRO 100 MG PO CAPS: 100.0000 mg | ORAL_CAPSULE | Freq: Every evening | ORAL | 1 refills | Status: DC | PRN

## 2018-03-29 NOTE — Progress Notes (Signed)
GYN ENCOUNTER NOTE  Subjective:       Danielle Schwartz is a 27 y.o. G0P0000 female here for medication follow up and lab collection.   Started Aldactone and Doxycycline in May 2019 for management of PCOS symptoms. Taking 100 mg Aldactone BID-needs refill. No longer taking Doxycycline. Never started OCPs.   Requests evaluation of right nipple discharge. States the nipple does not drip, but when squeezed white discharge is noted.   Denies difficulty breathing or respiratory distress, chest pain, abdominal pain, excessive vaginal bleeding, dysuria, and leg pain or swelling.    Gynecologic History  Patient's last menstrual period was 03/26/2018 (exact date).  Contraception: condoms  Last Pap: 10/19/2017. Results were: normal  Obstetric History  OB History  Gravida Para Term Preterm AB Living  0 0 0 0 0 0  SAB TAB Ectopic Multiple Live Births  0 0 0 0 0    Past Medical History:  Diagnosis Date  . Abnormal Pap smear of cervix    ascus/pos  . Acne   . Anxiety   . Heart murmur     Past Surgical History:  Procedure Laterality Date  . ETHMOIDECTOMY Bilateral 12/20/2015   Procedure: ENDOSCPIC ETHMOIDECTOMY TOTAL BILATERAL;  Surgeon: Vernie Murders, MD;  Location: [facility];  Service: ENT;  Laterality: Bilateral;  . FRONTAL SINUS EXPLORATION Bilateral 12/20/2015   Procedure:  BILATERAL;  Surgeon: Vernie Murders, MD;  Location: [facility];  Service: ENT;  Laterality: Bilateral;  . IMAGE GUIDED SINUS SURGERY N/A 12/20/2015   Procedure: IMAGE GUIDED SINUS SURGERY;  Surgeon: Vernie Murders, MD;  Location: [facility];  Service: ENT;  Laterality: N/A;  GAVE DISK TO CECE 6/28 DEE  . MAXILLARY ANTROSTOMY Bilateral 12/20/2015   Procedure: ENDOSCOPIC MAXILLARY ANTROSTOMY BILATERAL;  Surgeon: Vernie Murders, MD;  Location: [facility];  Service: ENT;  Laterality: Bilateral;  . SEPTOPLASTY N/A 12/20/2015   Procedure: SEPTOPLASTY;  Surgeon: Vernie Murders, MD;  Location:  [facility];  Service: ENT;  Laterality: N/A;  . WISDOM TOOTH EXTRACTION      Current Outpatient Medications on File Prior to Visit  Medication Sig Dispense Refill  . cetirizine (ZYRTEC) 10 MG chewable tablet Chew 10 mg by mouth daily.    . Diindolylmethane POWD by Does not apply route.    . Multiple Vitamin (MULTIVITAMIN) tablet Take 1 tablet by mouth daily.    . Probiotic Product (PROBIOTIC-10 PO) Take by mouth.    . Triamcinolone Acetonide (NASACORT ALLERGY 24HR NA) Place into the nose daily.     No current facility-administered medications on file prior to visit.     No Known Allergies  Social History   Socioeconomic History  . Marital status: Single    Spouse name: Not on file  . Number of children: Not on file  . Years of education: Not on file  . Highest education level: Not on file  Occupational History  . Not on file  Social Needs  . Financial resource strain: Not on file  . Food insecurity:    Worry: Not on file    Inability: Not on file  . Transportation needs:    Medical: Not on file    Non-medical: Not on file  Tobacco Use  . Smoking status: Former Smoker    Last attempt to quit: 07/20/2015    Years since quitting: 2.6  . Smokeless tobacco: Never Used  Substance and Sexual Activity  . Alcohol use: Yes    Alcohol/week: 6.0 standard drinks  Types: 6 Glasses of wine per week    Comment: weekly  . Drug use: No  . Sexual activity: Yes    Partners: Male    Birth control/protection: Condom  Lifestyle  . Physical activity:    Days per week: Not on file    Minutes per session: Not on file  . Stress: Not on file  Relationships  . Social connections:    Talks on phone: Not on file    Gets together: Not on file    Attends religious service: Not on file    Active member of club or organization: Not on file    Attends meetings of clubs or organizations: Not on file    Relationship status: Not on file  . Intimate partner violence:    Fear of  current or ex partner: Not on file    Emotionally abused: Not on file    Physically abused: Not on file    Forced sexual activity: Not on file  Other Topics Concern  . Not on file  Social History Narrative  . Not on file    Family History  Problem Relation Age of Onset  . Hyperlipidemia Mother   . Migraines Mother   . Migraines Sister   . Hyperlipidemia Brother   . Hypertension Brother   . Cancer Maternal Grandfather        colon cancer  . Diabetes Maternal Grandfather   . Cancer Paternal Grandmother        skin cancer  . Cancer Paternal Grandfather        bladder cancer/colon cancer    The following portions of the patient's history were reviewed and updated as appropriate: allergies, current medications, past family history, past medical history, past social history, past surgical history and problem list.  Review of Systems  ROS negative except as noted above. Information obtained from patient.   Objective:   BP 127/76   Pulse (!) 52   Ht 5\' 10"  (1.778 m)   Wt 157 lb (71.2 kg)   LMP 03/26/2018 (Exact Date)   BMI 22.53 kg/m   CONSTITUTIONAL: Well-developed, well-nourished female in no acute distress.   Breasts: right breast normal without mass, skin or nipple changes or axillary nodes; milky white discharge present after hand expression.  Assessment:   1. Discharge from right nipple  - Anaerobic and Aerobic Culture  2. PCOS (polycystic ovarian syndrome)  - Comprehensive metabolic panel - Prolactin; Future  3. Excessive hairiness  - Comprehensive metabolic panel - Prolactin; Future  4. Medication management  - Comprehensive metabolic panel - Prolactin; Future     Plan:   Labs: CMP and culture today; see orders. Return for prolactin level.   Rx: Aldactone and Macrobid, see orders. Medication education in AVS.   Reviewed red flag symptoms and when to call.   RTC as needed.    Gunnar Bulla, CNM [facility], [facility]

## 2018-03-29 NOTE — Patient Instructions (Addendum)
Galactorrhea Galactorrhea is the flow of a milky fluid (discharge) from the breast. It is different from normal milk in nursing mothers. It can be caused by many things. Most cases are not serious and do not require treatment. Watch your condition to make sure it goes away. Follow these instructions at home:  Take medicines only as told by your doctor.  Do not squeeze your breasts or nipples.  Avoid any touching of your breasts during sexual activity.  Perform a breast self-exam only once a month.  Avoid clothes that rub on your nipples.  Use breast pads to absorb the fluid.  Wear a support bra or a breast binder.  Keep all follow-up visits as told by your doctor. This is important. Contact a doctor if:  You have hot flashes.  You have vaginal dryness.  You have a lack of sexual desire.  You stop having menstrual periods, or they are far apart or not regular.  You have headaches.  You have vision problems. Get help right away if:  Your breast discharge is bloody or yellowish white (puslike).  You have breast pain.  You feel a lump in your breast.  Your breast shows wrinkling or dimpling.  Your breast becomes red and swollen. This information is not intended to replace advice given to you by your health care provider. Make sure you discuss any questions you have with your health care provider. Document Released: 07/12/2010 Document Revised: 11/15/2015 Document Reviewed: 01/10/2014 Elsevier Interactive Patient Education  201  Spironolactone tablets What is this medicine? SPIRONOLACTONE (speer on oh LAK tone) is a diuretic. It helps you make more urine and to lose excess water from your body. This medicine is used to treat high blood pressure, and edema or swelling from heart, kidney, or liver disease. It is also used to treat patients who make too much aldosterone or have low potassium. This medicine may be used for other purposes; ask your health care provider or  pharmacist if you have questions. COMMON BRAND NAME(S): Aldactone What should I tell my health care provider before I take this medicine? They need to know if you have any of these conditions: -high blood level of potassium -kidney disease or trouble making urine -liver disease -an unusual or allergic reaction to spironolactone, other medicines, foods, dyes, or preservatives -pregnant or trying to get pregnant -breast-feeding How should I use this medicine? Take this medicine by mouth with a drink of water. Follow the directions on your prescription label. You can take it with or without food. If it upsets your stomach, take it with food. Do not take your medicine more often than directed. Remember that you will need to pass more urine after taking this medicine. Do not take your doses at a time of day that will cause you problems. Do not take at bedtime. Talk to your pediatrician regarding the use of this medicine in children. While this drug may be prescribed for selected conditions, precautions do apply. Overdosage: If you think you have taken too much of this medicine contact a poison control center or emergency room at once. NOTE: This medicine is only for you. Do not share this medicine with others. What if I miss a dose? If you miss a dose, take it as soon as you can. If it is almost time for your next dose, take only that dose. Do not take double or extra doses. What may interact with this medicine? Do not take this medicine with any of the following  medications: -eplerenone This medicine may also interact with the following medications: -corticosteroids -digoxin -lithium -medicines for high blood pressure like ACE inhibitors -skeletal muscle relaxants like tubocurarine -NSAIDs, medicines for pain and inflammation, like ibuprofen or naproxen -potassium products like salt substitute or supplements -pressor amines like norepinephrine -some diuretics This list may not describe all  possible interactions. Give your health care provider a list of all the medicines, herbs, non-prescription drugs, or dietary supplements you use. Also tell them if you smoke, drink alcohol, or use illegal drugs. Some items may interact with your medicine. What should I watch for while using this medicine? Visit your doctor or health care professional for regular checks on your progress. Check your blood pressure as directed. Ask your doctor what your blood pressure should be, and when you should contact them. You may need to be on a special diet while taking this medicine. Ask your doctor. Also, ask how many glasses of fluid you need to drink a day. You must not get dehydrated. This medicine may make you feel confused, dizzy or lightheaded. Drinking alcohol and taking some medicines can make this worse. Do not drive, use machinery, or do anything that needs mental alertness until you know how this medicine affects you. Do not sit or stand up quickly. What side effects may I notice from receiving this medicine? Side effects that you should report to your doctor or health care professional as soon as possible: -allergic reactions such as skin rash or itching, hives, swelling of the lips, mouth, tongue, or throat -black or tarry stools -fast, irregular heartbeat -fever -muscle pain, cramps -numbness, tingling in hands or feet -trouble breathing -trProlactin Level Test Why am I having this test? Prolactin is a hormone that is produced by the pituitary gland. This test is often used to diagnose and monitor disease problems in the pituitary gland. Prolactin levels go up and down (fluctuate) due to stress, illness, trauma, or surgery. Increased levels are commonly associated with an absent menstrual cycle (amenorrhea) and tumors. What kind of sample is taken? A blood sample is required for this test. It is usually collected by inserting a needle into a vein. How do I prepare for this test? There is no  preparation required for this test. What are the reference ranges? Reference ranges are considered healthy ranges established after testing a large group of healthy people. Reference ranges may vary among different people, labs, and hospitals. It is your responsibility to obtain your test results. Ask the lab or department performing the test when and how you will get your results.  Adult female: less than 20 ng/mL.  Adult female: less than 28 ng/mL.  Pregnant female: ? Trimester 1: less than 80 ng/mL. ? Trimester 2: less than 160 ng/mL. ? Trimester 3: less than 400 ng/mL.  What do the results mean? Increased levels of prolactin may indicate:  A pituitary gland tumor.  Amenorrhea.  Hypothyroidism.  Certain pituitary or reproductive syndromes.  Kidney failure.  Decreased levels of prolactin may indicate:  Lack of blood to the pituitary gland.  Pituitary gland failure.  Talk with your health care provider to discuss your results, treatment options, and if necessary, the need for more tests. Talk with your health care provider if you have any questions about your results. Talk with your health care provider to discuss your results, treatment options, and if necessary, the need for more tests. Talk with your health care provider if you have any questions about your results. This information  is not intended to replace advice given to you by your health care provider. Make sure you discuss any questions you have with your health care provider. Document Released: 07/12/2004 Document Revised: 02/13/2016 Document Reviewed: 12/02/2013 Elsevier Interactive Patient Education  2018 [employer]. ouble passing urine -unusual bleeding -unusually weak or tired Side effects that usually do not require medical attention (report to your doctor or health care professional if they continue or are bothersome): -change in voice or hair growth -confusion -dizzy, drowsy -dry mouth, increased  thirst -enlarged or tender breasts -headache -irregular menstrual periods -sexual difficulty, unable to have an erection -stomach upset This list may not describe all possible side effects. Call your doctor for medical advice about side effects. You may report side effects to FDA at 1-800-FDA-1088. Where should I keep my medicine? Keep out of the reach of children. Store below 25 degrees C (77 degrees F). Throw away any unused medicine after the expiration date. NOTE: This sheet is a summary. It may not cover all possible information. If you have questions about this medicine, talk to your doctor, pharmacist, or health care provider.  2018 Elsevier/Gold Standard (2010-02-19 12:51:30) 8 [employer].

## 2018-03-30 LAB — COMPREHENSIVE METABOLIC PANEL
A/G RATIO: 2.2 (ref 1.2–2.2)
ALBUMIN: 4.4 g/dL (ref 3.5–5.5)
ALT: 11 IU/L (ref 0–32)
AST: 17 IU/L (ref 0–40)
Alkaline Phosphatase: 52 IU/L (ref 39–117)
BILIRUBIN TOTAL: 0.3 mg/dL (ref 0.0–1.2)
BUN / CREAT RATIO: 18 (ref 9–23)
BUN: 13 mg/dL (ref 6–20)
CHLORIDE: 105 mmol/L (ref 96–106)
CO2: 23 mmol/L (ref 20–29)
Calcium: 9.6 mg/dL (ref 8.7–10.2)
Creatinine, Ser: 0.72 mg/dL (ref 0.57–1.00)
GFR calc Af Amer: 135 mL/min/{1.73_m2} (ref >59)
GFR, EST NON AFRICAN AMERICAN: 117 mL/min/{1.73_m2} (ref >59)
Globulin, Total: 2 g/dL (ref 1.5–4.5)
Glucose: 89 mg/dL (ref 65–99)
POTASSIUM: 4.4 mmol/L (ref 3.5–5.2)
Sodium: 141 mmol/L (ref 134–144)
TOTAL PROTEIN: 6.4 g/dL (ref 6.0–8.5)

## 2018-04-03 LAB — ANAEROBIC AND AEROBIC CULTURE

## 2018-04-12 ENCOUNTER — Other Ambulatory Visit: Payer: BLUE CROSS/BLUE SHIELD

## 2018-05-06 DIAGNOSIS — R112 Nausea with vomiting, unspecified: Secondary | ICD-10-CM | POA: Diagnosis not present

## 2018-05-06 DIAGNOSIS — R1011 Right upper quadrant pain: Secondary | ICD-10-CM | POA: Diagnosis not present

## 2018-05-06 DIAGNOSIS — R1013 Epigastric pain: Secondary | ICD-10-CM | POA: Diagnosis not present

## 2018-05-06 DIAGNOSIS — L578 Other skin changes due to chronic exposure to nonionizing radiation: Secondary | ICD-10-CM | POA: Diagnosis not present

## 2018-05-06 DIAGNOSIS — Z1283 Encounter for screening for malignant neoplasm of skin: Secondary | ICD-10-CM | POA: Diagnosis not present

## 2018-05-06 DIAGNOSIS — K8020 Calculus of gallbladder without cholecystitis without obstruction: Secondary | ICD-10-CM | POA: Diagnosis not present

## 2018-05-06 DIAGNOSIS — R42 Dizziness and giddiness: Secondary | ICD-10-CM | POA: Diagnosis not present

## 2018-05-06 DIAGNOSIS — K8050 Calculus of bile duct without cholangitis or cholecystitis without obstruction: Secondary | ICD-10-CM | POA: Diagnosis not present

## 2018-05-06 DIAGNOSIS — D229 Melanocytic nevi, unspecified: Secondary | ICD-10-CM | POA: Diagnosis not present

## 2018-05-06 DIAGNOSIS — R10816 Epigastric abdominal tenderness: Secondary | ICD-10-CM | POA: Diagnosis not present

## 2018-05-06 DIAGNOSIS — R109 Unspecified abdominal pain: Secondary | ICD-10-CM | POA: Diagnosis not present

## 2018-05-10 ENCOUNTER — Ambulatory Visit: Payer: BLUE CROSS/BLUE SHIELD | Admitting: General Surgery

## 2018-05-10 ENCOUNTER — Other Ambulatory Visit: Payer: Self-pay

## 2018-05-10 ENCOUNTER — Encounter: Payer: Self-pay | Admitting: General Surgery

## 2018-05-10 VITALS — BP 118/68 | HR 56 | Temp 97.9°F | Resp 16 | Ht 70.0 in | Wt 153.4 lb

## 2018-05-10 DIAGNOSIS — K8020 Calculus of gallbladder without cholecystitis without obstruction: Secondary | ICD-10-CM

## 2018-05-10 NOTE — Progress Notes (Signed)
Patient ID: Danielle Schwartz, female   DOB: [birth year], 27 y.o.   MRN: [id number]  Chief Complaint  Patient presents with  . New Patient (Initial Visit)    Gallbladder    HPI Danielle Schwartz is a 27 y.o. female.  Here today for evaluation of her gallbladder. Complains of nausea, rare vomiting. Pain right upper quadrant. Denies fever. Well until three weeks ago when she was awakened from sleep with stabbing pains in the RUQ. Passed by morning. Recurrent symptoms 5 days ago, progressed through the day. Seen at [facility] with results below.  Strong family history of biliary tract disease including her mother, older brother and paternal aunt.   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   HPI  Past Medical History:  Diagnosis Date  . Abnormal Pap smear of cervix    ascus/pos  . Acne   . Anxiety   . Heart murmur     Past Surgical History:  Procedure Laterality Date  . ETHMOIDECTOMY Bilateral 12/20/2015   Procedure: ENDOSCPIC ETHMOIDECTOMY TOTAL BILATERAL;  Surgeon: Margaretha Sheffield, MD;  Location: [place];  Service: ENT;  Laterality: Bilateral;  . FRONTAL SINUS EXPLORATION Bilateral 12/20/2015   Procedure:  BILATERAL;  Surgeon: Margaretha Sheffield, MD;  Location: [place];  Service: ENT;  Laterality: Bilateral;  . IMAGE GUIDED SINUS SURGERY N/A 12/20/2015   Procedure: IMAGE GUIDED SINUS SURGERY;  Surgeon: Margaretha Sheffield, MD;  Location: [place];  Service: ENT;  Laterality: N/A;  GAVE DISK TO CE[facility] 6/28 DEE  . MAXILLARY ANTROSTOMY Bilateral 12/20/2015   Procedure: ENDOSCOPIC MAXILLARY ANTROSTOMY BILATERAL;  Surgeon: Margaretha Sheffield, MD;  Location: [place];  Service: ENT;  Laterality: Bilateral;  . SEPTOPLASTY N/A 12/20/2015   Procedure: SEPTOPLASTY;  Surgeon: Margaretha Sheffield, MD;  Location: [place];  Service: ENT;  Laterality: N/A;  . WISDOM TOOTH EXTRACTION      Family History  Problem Relation Age of Onset  .  Hyperlipidemia Mother   . Migraines Mother   . Migraines Sister   . Hyperlipidemia Brother   . Hypertension Brother   . Cancer Maternal Grandfather        colon cancer  . Diabetes Maternal Grandfather   . Cancer Paternal Grandmother        skin cancer  . Cancer Paternal Grandfather        bladder cancer/colon cancer    Social History Social History   Tobacco Use  . Smoking status: Former Smoker    Last attempt to quit: 07/20/2015    Years since quitting: 2.8  . Smokeless tobacco: Never Used  Substance Use Topics  . Alcohol use: Yes    Alcohol/week: 6.0 standard drinks    Types: 6 Glasses of wine per week    Comment: weekly  . Drug use: No    No Known Allergies  Current Outpatient Medications  Medication Sig Dispense Refill  . cetirizine (ZYRTEC) 10 MG chewable tablet Chew 10 mg by mouth daily.    . Diindolylmethane POWD by Does not apply route.    Marland Kitchen HYDROcodone-acetaminophen (NORCO/VICODIN) 5-325 MG tablet Take by mouth.    . Multiple Vitamin (MULTIVITAMIN) tablet Take 1 tablet by mouth daily.    . nitrofurantoin, macrocrystal-monohydrate, (MACROBID) 100 MG capsule Take 1 capsule (100 mg total) by mouth at bedtime as needed. After intercourse to prevent infection. 30 capsule 1  . ondansetron (ZOFRAN-ODT) 4 MG disintegrating tablet Take by mouth.    . Probiotic Product (  PROBIOTIC-10 PO) Take by mouth.    . spironolactone (ALDACTONE) 50 MG tablet Take 1 tablet (50 mg total) by mouth 2 (two) times daily. 60 tablet 6  . Triamcinolone Acetonide (NASACORT ALLERGY 24HR NA) Place into the nose daily.     No current facility-administered medications for this visit.     Review of Systems Review of Systems  Constitutional: Positive for chills. Negative for fever and unexpected weight change.  HENT: Negative.   Respiratory: Negative.   Cardiovascular: Negative.   Gastrointestinal: Positive for abdominal pain and nausea.  Endocrine: Negative.   Genitourinary: Negative.    Allergic/Immunologic: Negative.   Neurological: Negative.   Hematological: Negative.   Psychiatric/Behavioral: Negative.     Blood pressure 118/68, pulse (!) 56, temperature 97.9 F (36.6 C), temperature source Temporal, resp. rate 16, height 5' 10"  (1.778 m), weight 153 lb 6.4 oz (69.6 kg), SpO2 99 %.  Physical Exam Physical Exam  Constitutional: She is oriented to person, place, and time. She appears well-developed and well-nourished.  HENT:  Head: Normocephalic and atraumatic.  Eyes: Pupils are equal, round, and reactive to light.  Neck: Normal range of motion. Neck supple.  Cardiovascular: Normal rate, regular rhythm and normal heart sounds.  Pulmonary/Chest: Effort normal and breath sounds normal.  Abdominal: Normal appearance and bowel sounds are normal. There is no hepatosplenomegaly. There is tenderness in the right upper quadrant. No hernia.    Musculoskeletal: Normal range of motion.  Neurological: She is alert and oriented to person, place, and time.  Skin: Skin is warm and dry.  Psychiatric: She has a normal mood and affect. Her behavior is normal. Judgment and thought content normal.    Data Reviewed Component Name    05/06/2018   8.7  4.29  13.8  41.3  96.1  32.1  33.4  15.8 (H)  7.3  314  WBC  RBC  HGB  HCT  MCV  MCH  MCHC  RDW  MPV  Platelet   Component Name   05/06/2018 05/06/2018   142 Sodium  Potassium  Chloride  CO2  BUN  Creatinine  BUN/Creatinine Ratio  EGFR CKD-EPI Non-African American, Female  EGFR CKD-EPI African American, Female  Glucose  Calcium  Albumin  Total Protein  Total Bilirubin  AST  ALT  Alkaline Phosphatase  Anion Gap  Lip    4.0   111 (H)   21.0 (L)   15   0.71   21   >90   >90   104   9.1   4.5   7.2   0.8   20   13    39   10    50  v                                        Interface, Rad Results In - 05/07/2018  9:48 AM EST EXAM: US ABDOMEN  LIMITED DATE: 05/06/2018 7:16 PM ACCESSION: [id number] UN DICTATED: 05/06/2018 7:17 PM INTERPRETATION LOCATION: [place]  CLINICAL INDICATION: 27 years old Female with RUQ pain, vomiting    TECHNIQUE: Static and cine images of the right upper quadrant were performed.  COMPARISON: None  FINDINGS:   LIVER: The liver is normal in size and mildly echogenic. No focal hepatic lesions. No biliary ductal dilatation.   GALLBLADDER: The gallbladder is physiologically distended two nonmobile echogenic foci in the gallbladder neck measuring up to 0.9 cm.  Sonographic Murphy sign is positive.   No pericholecystic fluid. No gallbladder wall thickening.  LIMITED RIGHT KIDNEY: No hydronephrosis.  IMPRESSION: -Two nonmobile stones in the gallbladder neck measuring up to 0.9 cm with positive sonographic Murphy's sign. No gallbladder wall thickening. -No evidence of biliary ductal dilatation. -Echogenic liver which can be seen in hepatic steatosis or chronic liver disease.  ==================== ADDENDUM (05/07/2018 9:43 AM):   There is only one nonmobile small subcentimeter stone in the gallbladder. However, the gallbladder is not hydropic and the gallbladder wall is in normal thickness. No evidence of pericholecystic free fluid. Although the sonographic Murphy's sign is positive per the technologist, the grayscale ultrasound findings of the gallbladder appear unremarkable except the nonmobile stone at the level of neck. Therefore, clinical correlation is recommended. If there is high clinical concern, further evaluation with MRI and MRCP could be helpful.  The liver parenchymal echogenicity is also normal.     Please see below for data measurements:   Liver: 13.53 cm  Gallbladder wall: 0.27 mm Sonographic Murphy's Sign: yes Pericholecystic fluid visualized: no  Common hepatic duct: 0.20 cm Proximal common bile duct: 0.23 cm Distal common bile duct: Not visualized  Right kidney length:  11.71 cm  Assessment    Chronic cholecystitis and cholelithiasis, less likely acute gastritis.     Plan    Laparoscopic Cholecystectomy with Intraoperative Cholangiogram. The procedure, including it's potential risks and complications (including but not limited to infection, bleeding, injury to intra-abdominal organs or bile ducts, bile leak, poor cosmetic result, sepsis and death) were discussed with the patient in detail. Non-operative options, including their inherent risks (acute calculous cholecystitis with possible choledocholithiasis or gallstone pancreatitis, with the risk of ascending cholangitis, sepsis, and death) were discussed as well. The patient expressed and understanding of what we discussed and wishes to proceed with laparoscopic cholecystectomy. The patient further understands that if it is technically not possible, or it is unsafe to proceed laparoscopically, that I will convert to an open cholecystectomy.        Forest Gleason Nicolae Vasek 05/10/2018, 5:32 PM  Patient's surgery has been scheduled for tomorrow, 05-11-18 at [facility] with Dr. Bary Castilla. Surgery to be posted for 7:30 am. The patient is aware to arrive at the Admitting desk at the [place] [facility] at 6 am. She is aware to be NPO after midnight and have a driver.   The patient is aware to call the office should they have further questions.    Dominga Ferry, CMA

## 2018-05-10 NOTE — Patient Instructions (Signed)
You have requested to have your gallbladder removed. This will be done at [facility] with Dr. Inocente Salles.  You will most likely be out of work 1-2 weeks for this surgery. You will return after your post-op appointment with a lifting restriction for approximately 4 more weeks.  You will be able to eat anything you would like to following surgery. But, start by eating a bland diet and advance this as tolerated. The Gallbladder diet is below, please go as closely by this diet as possible prior to surgery to avoid any further attacks.  Please see the (blue)pre-care form that you have been given today. If you have any questions, please call our office.  Laparoscopic Cholecystectomy Laparoscopic cholecystectomy is surgery to remove the gallbladder. The gallbladder is located in the upper right part of the abdomen, behind the liver. It is a storage sac for bile, which is produced in the liver. Bile aids in the digestion and absorption of fats. Cholecystectomy is often done for inflammation of the gallbladder (cholecystitis). This condition is usually caused by a buildup of gallstones (cholelithiasis) in the gallbladder. Gallstones can block the flow of bile, and that can result in inflammation and pain. In severe cases, emergency surgery may be required. If emergency surgery is not required, you will have time to prepare for the procedure. Laparoscopic surgery is an alternative to open surgery. Laparoscopic surgery has a shorter recovery time. Your common bile duct may also need to be examined during the procedure. If stones are found in the common bile duct, they may be removed. LET [facility] CARE PROVIDER KNOW ABOUT:  Any allergies you have.  All medicines you are taking, including vitamins, herbs, eye drops, creams, and over-the-counter medicines.  Previous problems you or members of your family have had with the use of anesthetics.  Any blood disorders you have.  Previous surgeries  you have had.    Any medical conditions you have. RISKS AND COMPLICATIONS Generally, this is a safe procedure. However, problems may occur, including:  Infection.  Bleeding.  Allergic reactions to medicines.  Damage to other structures or organs.  A stone remaining in the common bile duct.  A bile leak from the cyst duct that is clipped when your gallbladder is removed.  The need to convert to open surgery, which requires a larger incision in the abdomen. This may be necessary if your surgeon thinks that it is not safe to continue with a laparoscopic procedure. BEFORE THE PROCEDURE  Ask your health care provider about:  Changing or stopping your regular medicines. This is especially important if you are taking diabetes medicines or blood thinners.  Taking medicines such as aspirin and ibuprofen. These medicines can thin your blood. Do not take these medicines before your procedure if your health care provider instructs you not to.  Follow instructions from your health care provider about eating or drinking restrictions.  Let your health care provider know if you develop a cold or an infection before surgery.  Plan to have someone take you home after the procedure.  Ask your health care provider how your surgical site will be marked or identified.  You may be given antibiotic medicine to help prevent infection. PROCEDURE  To reduce your risk of infection:  Your health care team will wash or sanitize their hands.  Your skin will be washed with soap.  An IV tube may be inserted into one of your veins.  You will be given a medicine to make you  fall asleep (general anesthetic).  A breathing tube will be placed in your mouth.  The surgeon will make several small cuts (incisions) in your abdomen.  A thin, lighted tube (laparoscope) that has a tiny camera on the end will be inserted through one of the small incisions. The camera on the laparoscope will send a picture to  a TV screen (monitor) in the operating room. This will give the surgeon a good view inside your abdomen.  A gas will be pumped into your abdomen. This will expand your abdomen to give the surgeon more room to perform the surgery.  Other tools that are needed for the procedure will be inserted through the other incisions. The gallbladder will be removed through one of the incisions.  After your gallbladder has been removed, the incisions will be closed with stitches (sutures), staples, or skin glue.  Your incisions may be covered with a bandage (dressing). The procedure may vary among health care providers and hospitals. AFTER THE PROCEDURE  Your blood pressure, heart rate, breathing rate, and blood oxygen level will be monitored often until the medicines you were given have worn off.  You will be given medicines as needed to control your pain.   This information is not intended to replace advice given to you by your health care provider. Make sure you discuss any questions you have with your health care provider.   Document Released: 06/09/2005 Document Revised: 02/28/2015 Document Reviewed: 01/19/2013 Elsevier Interactive Patient Education 2016 [place] Diet for Gallbladder Conditions A low-fat diet can be helpful if you have pancreatitis or a gallbladder condition. With these conditions, your pancreas and gallbladder have trouble digesting fats. A healthy eating plan with less fat will help rest your pancreas and gallbladder and reduce your symptoms. WHAT DO I NEED TO KNOW ABOUT THIS DIET?  Eat a low-fat diet.  Reduce your fat intake to less than 20-30% of your total daily calories. This is less than 50-60 g of fat per day.  Remember that you need some fat in your diet. Ask your dietician what your daily goal should be.  Choose nonfat and low-fat healthy foods. Look for the words "nonfat," "low fat," or "fat free."  As a guide, look on the label and choose foods  with less than 3 g of fat per serving. Eat only one serving.  Avoid alcohol.  Do not smoke. If you need help quitting, talk with your health care provider.  Eat small frequent meals instead of three large heavy meals. WHAT FOODS CAN I EAT? Grains Include healthy grains and starches such as potatoes, wheat bread, fiber-rich cereal, and brown rice. Choose whole grain options whenever possible. In adults, whole grains should account for 45-65% of your daily calories.  Fruits and Vegetables Eat plenty of fruits and vegetables. Fresh fruits and vegetables add fiber to your diet. Meats and Other Protein Sources Eat lean meat such as chicken and pork. Trim any fat off of meat before cooking it. Eggs, fish, and beans are other sources of protein. In adults, these foods should account for 10-35% of your daily calories. Dairy Choose low-fat milk and dairy options. Dairy includes fat and protein, as well as calcium.  Fats and Oils Limit high-fat foods such as fried foods, sweets, baked goods, sugary drinks.  Other Creamy sauces and condiments, such as mayonnaise, can add extra fat. Think about whether or not you need to use them, or use smaller amounts or low fat options. WHAT  FOODS ARE NOT RECOMMENDED?  High fat foods, such as:  Tes[employer] CorporationBaked goods.  Ice cream.  [place] toast.  Sweet rolls.  Pizza.  Cheese bread.  Foods covered with batter, butter, creamy sauces, or cheese.  Fried foods.  Sugary drinks and desserts.  Foods that cause gas or bloating   This information is not intended to replace advice given to you by your health care provider. Make sure you discuss any questions you have with your health care provider.   Document Released: 06/14/2013 Document Reviewed: 06/14/2013 Elsevier Interactive Patient Education 2016 Elsevier Inc.       Low-Fat Diet for Pancreatitis or Gallbladder Conditions A low-fat diet can be helpful if you have pancreatitis or a gallbladder condition.  With these conditions, your pancreas and gallbladder have trouble digesting fats. A healthy eating plan with less fat will help rest your pancreas and gallbladder and reduce your symptoms. What do I need to know about this diet?  Eat a low-fat diet. ? Reduce your fat intake to less than 20-30% of your total daily calories. This is less than 50-60 g of fat per day. ? Remember that you need some fat in your diet. Ask your dietician what your daily goal should be. ? Choose nonfat and low-fat healthy foods. Look for the words "nonfat," "low fat," or "fat free." ? As a guide, look on the label and choose foods with less than 3 g of fat per serving. Eat only one serving.  Avoid alcohol.  Do not smoke. If you need help quitting, talk with your health care provider.  Eat small frequent meals instead of three large heavy meals. What foods can I eat? Grains Include healthy grains and starches such as potatoes, wheat bread, fiber-rich cereal, and brown rice. Choose whole grain options whenever possible. In adults, whole grains should account for 45-65% of your daily calories. Fruits and Vegetables Eat plenty of fruits and vegetables. Fresh fruits and vegetables add fiber to your diet. Meats and Other Protein Sources Eat lean meat such as chicken and pork. Trim any fat off of meat before cooking it. Eggs, fish, and beans are other sources of protein. In adults, these foods should account for 10-35% of your daily calories. Dairy Choose low-fat milk and dairy options. Dairy includes fat and protein, as well as calcium. Fats and Oils Limit high-fat foods such as fried foods, sweets, baked goods, sugary drinks. Other Creamy sauces and condiments, such as mayonnaise, can add extra fat. Think about whether or not you need to use them, or use smaller amounts or low fat options. What foods are not recommended?  High fat foods, such as: ? Tesoro CorporationBaked goods. ? Ice cream. ? JamaicaFrench toast. ? Sweet  rolls. ? Pizza. ? Cheese bread. ? Foods covered with batter, butter, creamy sauces, or cheese. ? Fried foods. ? Sugary drinks and desserts.  Foods that cause gas or bloating This information is not intended to replace advice given to you by your health care provider. Make sure you discuss any questions you have with your health care provider. Document Released: 06/14/2013 Document Revised: 11/15/2015 Document Reviewed: 05/23/2013 Elsevier Interactive Patient Education  2017 [employer]

## 2018-05-11 ENCOUNTER — Ambulatory Visit: Payer: BLUE CROSS/BLUE SHIELD | Admitting: Anesthesiology

## 2018-05-11 ENCOUNTER — Encounter
Admission: RE | Disposition: A | Discharge status: Home / Self Care | Payer: Self-pay | Source: Ambulatory Visit | Attending: General Surgery

## 2018-05-11 ENCOUNTER — Ambulatory Visit: Payer: BLUE CROSS/BLUE SHIELD

## 2018-05-11 ENCOUNTER — Ambulatory Visit
Admission: RE | Admit: 2018-05-11 | Discharge: 2018-05-11 | Disposition: A | Discharge status: Home / Self Care | Payer: BLUE CROSS/BLUE SHIELD | Source: Ambulatory Visit | Attending: General Surgery | Admitting: General Surgery

## 2018-05-11 ENCOUNTER — Encounter: Payer: Self-pay | Admitting: *Deleted

## 2018-05-11 DIAGNOSIS — Z87891 Personal history of nicotine dependence: Secondary | ICD-10-CM | POA: Insufficient documentation

## 2018-05-11 DIAGNOSIS — Z79899 Other long term (current) drug therapy: Secondary | ICD-10-CM | POA: Insufficient documentation

## 2018-05-11 DIAGNOSIS — Z79891 Long term (current) use of opiate analgesic: Secondary | ICD-10-CM | POA: Diagnosis not present

## 2018-05-11 DIAGNOSIS — Z419 Encounter for procedure for purposes other than remedying health state, unspecified: Secondary | ICD-10-CM

## 2018-05-11 DIAGNOSIS — R1011 Right upper quadrant pain: Secondary | ICD-10-CM | POA: Diagnosis present

## 2018-05-11 DIAGNOSIS — K811 Chronic cholecystitis: Secondary | ICD-10-CM | POA: Insufficient documentation

## 2018-05-11 DIAGNOSIS — K219 Gastro-esophageal reflux disease without esophagitis: Secondary | ICD-10-CM | POA: Diagnosis not present

## 2018-05-11 DIAGNOSIS — K8020 Calculus of gallbladder without cholecystitis without obstruction: Secondary | ICD-10-CM | POA: Diagnosis not present

## 2018-05-11 DIAGNOSIS — K8010 Calculus of gallbladder with chronic cholecystitis without obstruction: Secondary | ICD-10-CM | POA: Diagnosis not present

## 2018-05-11 HISTORY — PX: CHOLECYSTECTOMY: SHX55

## 2018-05-11 SURGERY — LAPAROSCOPIC CHOLECYSTECTOMY WITH INTRAOPERATIVE CHOLANGIOGRAM
Anesthesia: General

## 2018-05-11 MED ORDER — ROCURONIUM BROMIDE 50 MG/5ML IV SOLN
INTRAVENOUS | Status: AC
  Filled 2018-05-11: qty 1

## 2018-05-11 MED ORDER — FENTANYL CITRATE (PF) 100 MCG/2ML IJ SOLN
INTRAMUSCULAR | Status: DC | PRN
  Administered 2018-05-11: 50 ug via INTRAVENOUS
  Administered 2018-05-11: 100 ug via INTRAVENOUS
  Administered 2018-05-11: 50 ug via INTRAVENOUS

## 2018-05-11 MED ORDER — MIDAZOLAM HCL 2 MG/2ML IJ SOLN
INTRAMUSCULAR | Status: DC | PRN
  Administered 2018-05-11: 2 mg via INTRAVENOUS

## 2018-05-11 MED ORDER — ONDANSETRON HCL 4 MG/2ML IJ SOLN
INTRAMUSCULAR | Status: AC
  Filled 2018-05-11: qty 2

## 2018-05-11 MED ORDER — FENTANYL CITRATE (PF) 100 MCG/2ML IJ SOLN
25.0000 ug | INTRAMUSCULAR | Status: DC | PRN
  Administered 2018-05-11 (×2): 25 ug via INTRAVENOUS

## 2018-05-11 MED ORDER — DEXAMETHASONE SODIUM PHOSPHATE 10 MG/ML IJ SOLN
INTRAMUSCULAR | Status: DC | PRN
  Administered 2018-05-11: 10 mg via INTRAVENOUS

## 2018-05-11 MED ORDER — SUCCINYLCHOLINE CHLORIDE 20 MG/ML IJ SOLN
INTRAMUSCULAR | Status: AC
  Filled 2018-05-11: qty 1

## 2018-05-11 MED ORDER — FENTANYL CITRATE (PF) 100 MCG/2ML IJ SOLN
INTRAMUSCULAR | Status: AC
  Filled 2018-05-11: qty 2

## 2018-05-11 MED ORDER — LIDOCAINE HCL (CARDIAC) PF 100 MG/5ML IV SOSY
PREFILLED_SYRINGE | INTRAVENOUS | Status: DC | PRN
  Administered 2018-05-11: 60 mg via INTRAVENOUS

## 2018-05-11 MED ORDER — PROPOFOL 10 MG/ML IV BOLUS
INTRAVENOUS | Status: DC | PRN
  Administered 2018-05-11: 150 mg via INTRAVENOUS

## 2018-05-11 MED ORDER — MIDAZOLAM HCL 2 MG/2ML IJ SOLN
INTRAMUSCULAR | Status: AC
  Filled 2018-05-11: qty 2

## 2018-05-11 MED ORDER — KETOROLAC TROMETHAMINE 30 MG/ML IJ SOLN
INTRAMUSCULAR | Status: DC | PRN
  Administered 2018-05-11: 30 mg via INTRAVENOUS

## 2018-05-11 MED ORDER — ONDANSETRON HCL 4 MG/2ML IJ SOLN
INTRAMUSCULAR | Status: DC | PRN
  Administered 2018-05-11: 4 mg via INTRAVENOUS

## 2018-05-11 MED ORDER — KETOROLAC TROMETHAMINE 30 MG/ML IJ SOLN
INTRAMUSCULAR | Status: AC
  Filled 2018-05-11: qty 1

## 2018-05-11 MED ORDER — DEXAMETHASONE SODIUM PHOSPHATE 10 MG/ML IJ SOLN
INTRAMUSCULAR | Status: AC
  Filled 2018-05-11: qty 1

## 2018-05-11 MED ORDER — BUPIVACAINE-EPINEPHRINE (PF) 0.5% -1:200000 IJ SOLN
INTRAMUSCULAR | Status: DC | PRN
  Administered 2018-05-11: 15 mL

## 2018-05-11 MED ORDER — GABAPENTIN 300 MG PO CAPS
300.0000 mg | ORAL_CAPSULE | ORAL | Status: AC
  Administered 2018-05-11: 300 mg via ORAL

## 2018-05-11 MED ORDER — SODIUM CHLORIDE (PF) 0.9 % IJ SOLN
INTRAMUSCULAR | Status: AC
  Filled 2018-05-11: qty 50

## 2018-05-11 MED ORDER — PROPOFOL 10 MG/ML IV BOLUS
INTRAVENOUS | Status: AC
  Filled 2018-05-11: qty 20

## 2018-05-11 MED ORDER — SODIUM CHLORIDE 0.9 % IV SOLN
INTRAVENOUS | Status: DC | PRN
  Administered 2018-05-11: 11 mL

## 2018-05-11 MED ORDER — ACETAMINOPHEN 10 MG/ML IV SOLN
INTRAVENOUS | Status: AC
  Filled 2018-05-11: qty 100

## 2018-05-11 MED ORDER — ACETAMINOPHEN 10 MG/ML IV SOLN
INTRAVENOUS | Status: DC | PRN
  Administered 2018-05-11: 1000 mg via INTRAVENOUS

## 2018-05-11 MED ORDER — SUGAMMADEX SODIUM 200 MG/2ML IV SOLN
INTRAVENOUS | Status: AC
  Filled 2018-05-11: qty 2

## 2018-05-11 MED ORDER — ROCURONIUM BROMIDE 100 MG/10ML IV SOLN
INTRAVENOUS | Status: DC | PRN
  Administered 2018-05-11: 40 mg via INTRAVENOUS

## 2018-05-11 MED ORDER — HYDROCODONE-ACETAMINOPHEN 5-325 MG PO TABS
ORAL_TABLET | ORAL | Status: AC
  Filled 2018-05-11: qty 1

## 2018-05-11 MED ORDER — LACTATED RINGERS IV SOLN
INTRAVENOUS | Status: DC
  Administered 2018-05-11: 07:00:00 via INTRAVENOUS

## 2018-05-11 MED ORDER — HYDROCODONE-ACETAMINOPHEN 5-325 MG PO TABS
1.0000 | ORAL_TABLET | ORAL | Status: DC | PRN
  Administered 2018-05-11: 1 via ORAL

## 2018-05-11 MED ORDER — SUGAMMADEX SODIUM 200 MG/2ML IV SOLN
INTRAVENOUS | Status: DC | PRN
  Administered 2018-05-11: 140 mg via INTRAVENOUS

## 2018-05-11 MED ORDER — BUPIVACAINE-EPINEPHRINE (PF) 0.5% -1:200000 IJ SOLN
INTRAMUSCULAR | Status: AC
  Filled 2018-05-11: qty 30

## 2018-05-11 MED ORDER — GABAPENTIN 300 MG PO CAPS
ORAL_CAPSULE | ORAL | Status: AC
  Filled 2018-05-11: qty 1

## 2018-05-11 MED ORDER — ONDANSETRON HCL 4 MG/2ML IJ SOLN
4.0000 mg | Freq: Once | INTRAMUSCULAR | Status: AC | PRN
  Administered 2018-05-11: 4 mg via INTRAVENOUS

## 2018-05-11 MED ORDER — LIDOCAINE HCL (PF) 2 % IJ SOLN
INTRAMUSCULAR | Status: AC
  Filled 2018-05-11: qty 10

## 2018-05-11 SURGICAL SUPPLY — 39 items
APPLIER CLIP ROT 10 11.4 M/L (STAPLE) ×2
BLADE SURG 11 STRL SS SAFETY (MISCELLANEOUS) ×2 IMPLANT
CANISTER SUCT 1200ML W/VALVE (MISCELLANEOUS) ×2 IMPLANT
CANNULA DILATOR 10 W/SLV (CANNULA) ×2 IMPLANT
CANNULA DILATOR 5 W/SLV (CANNULA) ×4 IMPLANT
CATH CHOLANG 76X19 KUMAR (CATHETERS) ×2 IMPLANT
CHLORAPREP W/TINT 26ML (MISCELLANEOUS) ×2 IMPLANT
CLIP APPLIE ROT 10 11.4 M/L (STAPLE) ×1 IMPLANT
CONRAY 60ML FOR OR (MISCELLANEOUS) ×2 IMPLANT
COVER WAND RF STERILE (DRAPES) IMPLANT
DISSECTOR KITTNER STICK (MISCELLANEOUS) IMPLANT
DISSECTORS/KITTNER STICK (MISCELLANEOUS)
DRAPE SHEET LG 3/4 BI-LAMINATE (DRAPES) ×2 IMPLANT
DRSG TEGADERM 2-3/8X2-3/4 SM (GAUZE/BANDAGES/DRESSINGS) ×8 IMPLANT
DRSG TELFA 4X3 1S NADH ST (GAUZE/BANDAGES/DRESSINGS) ×2 IMPLANT
ELECT REM PT RETURN 9FT ADLT (ELECTROSURGICAL) ×2
ELECTRODE REM PT RTRN 9FT ADLT (ELECTROSURGICAL) ×1 IMPLANT
GLOVE BIO SURGEON STRL SZ7.5 (GLOVE) ×6 IMPLANT
GLOVE INDICATOR 8.0 STRL GRN (GLOVE) ×6 IMPLANT
GOWN STRL REUS W/ TWL LRG LVL3 (GOWN DISPOSABLE) ×3 IMPLANT
GOWN STRL REUS W/TWL LRG LVL3 (GOWN DISPOSABLE) ×3
IRRIGATION STRYKERFLOW (MISCELLANEOUS) ×1 IMPLANT
IRRIGATOR STRYKERFLOW (MISCELLANEOUS) ×2
IV LACTATED RINGERS 1000ML (IV SOLUTION) ×2 IMPLANT
KIT TURNOVER KIT A (KITS) ×2 IMPLANT
LABEL OR SOLS (LABEL) ×2 IMPLANT
NDL INSUFF ACCESS 14 VERSASTEP (NEEDLE) ×2 IMPLANT
NEEDLE HYPO 25X1 1.5 SAFETY (NEEDLE) ×2 IMPLANT
NS IRRIG 500ML POUR BTL (IV SOLUTION) ×2 IMPLANT
PACK LAP CHOLECYSTECTOMY (MISCELLANEOUS) ×2 IMPLANT
POUCH SPECIMEN RETRIEVAL 10MM (ENDOMECHANICALS) ×2 IMPLANT
SCISSORS METZENBAUM CVD 33 (INSTRUMENTS) ×2 IMPLANT
STRIP CLOSURE SKIN 1/2X4 (GAUZE/BANDAGES/DRESSINGS) ×2 IMPLANT
SUT VIC AB 0 CT2 27 (SUTURE) ×2 IMPLANT
SUT VIC AB 4-0 FS2 27 (SUTURE) ×2 IMPLANT
SWABSTK COMLB BENZOIN TINCTURE (MISCELLANEOUS) ×2 IMPLANT
TROCAR XCEL NON-BLD 11X100MML (ENDOMECHANICALS) ×2 IMPLANT
TUBING INSUFFLATION (TUBING) ×2 IMPLANT
WATER STERILE IRR 1000ML POUR (IV SOLUTION) ×2 IMPLANT

## 2018-05-11 NOTE — Anesthesia Procedure Notes (Signed)
Procedure Name: Intubation Date/Time: 05/11/2018 7:42 AM Performed by: Omer JackWeatherly, Mitesh Rosendahl, CRNA Pre-anesthesia Checklist: Patient identified, Patient being monitored, Timeout performed, Emergency Drugs available and Suction available Patient Re-evaluated:Patient Re-evaluated prior to induction Oxygen Delivery Method: Circle system utilized Preoxygenation: Pre-oxygenation with 100% oxygen Induction Type: IV induction Ventilation: Mask ventilation without difficulty Laryngoscope Size: Miller and 2 Grade View: Grade I Tube type: Oral Tube size: 7.0 mm Number of attempts: 1 Placement Confirmation: ETT inserted through vocal cords under direct vision,  positive ETCO2 and breath sounds checked- equal and bilateral Secured at: 21 cm Tube secured with: Tape Dental Injury: Teeth and Oropharynx as per pre-operative assessment

## 2018-05-11 NOTE — Anesthesia Preprocedure Evaluation (Signed)
Anesthesia Evaluation  Patient identified by MRN, date of birth, ID band Patient awake    Reviewed: Allergy & Precautions, H&P , NPO status , Patient's Chart, lab work & pertinent test results, reviewed documented beta blocker date and time   Airway Mallampati: II  TM Distance: >3 FB Neck ROM: full    Dental  (+) Teeth Intact   Pulmonary neg pulmonary ROS, former smoker,    Pulmonary exam normal        Cardiovascular Exercise Tolerance: Good negative cardio ROS Normal cardiovascular exam+ Valvular Problems/Murmurs  Rhythm:regular Rate:Normal     Neuro/Psych  Headaches, negative neurological ROS  negative psych ROS   GI/Hepatic negative GI ROS, Neg liver ROS,   Endo/Other  negative endocrine ROS  Renal/GU negative Renal ROS  negative genitourinary   Musculoskeletal   Abdominal   Peds  Hematology negative hematology ROS (+)   Anesthesia Other Findings Past Medical History: No date: Abnormal Pap smear of cervix     Comment:  ascus/pos No date: Acne No date: Anxiety No date: Heart murmur Past Surgical History: 12/20/2015: ETHMOIDECTOMY; Bilateral     Comment:  Procedure: ENDOSCPIC ETHMOIDECTOMY TOTAL BILATERAL;                Surgeon: Vernie MurdersPaul Juengel, MD;  [facility];  Service: ENT;  Laterality: Bilateral; 12/20/2015: FRONTAL SINUS EXPLORATION; Bilateral     Comment:  Procedure:  BILATERAL;  Surgeon: Vernie MurdersPaul Juengel, MD;                [facility];  Service: ENT;                Laterality: Bilateral; 12/20/2015: IMAGE GUIDED SINUS SURGERY; N/A     Comment:  Procedure: IMAGE GUIDED SINUS SURGERY;  Surgeon: Vernie MurdersPaul               Juengel, MD;  [facility];  Service:               ENT;  Laterality: N/A;  GAVE DISK TO [facility] 6/28 DEE 12/20/2015: MAXILLARY ANTROSTOMY; Bilateral     Comment:  Procedure: ENDOSCOPIC MAXILLARY ANTROSTOMY BILATERAL;    Surgeon: Vernie MurdersPaul Juengel, MD;  [facility];  Service: ENT;  Laterality: Bilateral; 12/20/2015: SEPTOPLASTY; N/A     Comment:  Procedure: SEPTOPLASTY;  Surgeon: Vernie MurdersPaul Juengel, MD;                [facility];  Service: ENT;                Laterality: N/A; No date: WISDOM TOOTH EXTRACTION BMI    Body Mass Index:  22.01 kg/m     Reproductive/Obstetrics negative OB ROS                             Anesthesia Physical Anesthesia Plan  ASA: II  Anesthesia Plan: General ETT   Post-op Pain Management:    Induction:   PONV Risk Score and Plan:   Airway Management Planned:   Additional Equipment:   Intra-op Plan:   Post-operative Plan:   Informed Consent: I have reviewed the patients History and Physical, chart, labs and discussed the procedure including the risks, benefits and alternatives for the proposed anesthesia with the patient or  authorized representative who has indicated his/her understanding and acceptance.   Dental Advisory Given  Plan Discussed with: CRNA  Anesthesia Plan Comments:         Anesthesia Quick Evaluation

## 2018-05-11 NOTE — Discharge Instructions (Signed)

## 2018-05-11 NOTE — Op Note (Signed)
Preoperative diagnosis: Subacute cholecystitis and cholelithiasis.  Postoperative diagnosis: Subacute cholecystitis.  Operative procedure: Laparoscopic cholecystectomy with intraoperative cholangiograms.  Operating Surgeon: Donnalee CurryJeffrey Byrnett, MD.  Anesthesia: General endotracheal.  Estimated blood loss: Less than 5 cc.  Clinical note: This 27 year old woman was well until 3 weeks ago when she was awakened from sleep with right upper quadrant pain.  This resolved spontaneously.  6 days ago she had a recurrent episode with lingering symptoms since that time.  [facility].  Laboratory studies were normal.  Considering her young age and family history she was felt to be a candidate for elective cholecystectomy.  Operative note: With the patient under adequate general endotracheal anesthesia the abdomen was cleansed with ChloraPrep and draped.  In Trendelenburg position a varies needle was placed with trans-umbilical incision.  After assuring intra-abdominal location with a hanging drop test the abdomen was insufflated with CO2 of 10 mmHg pressure.  A 10 mm Step port was expanded.  Inspection showed no evidence of injury from initial port placement.  There were some adhesions noted in the area of the cecum and ascending colon to the anterior abdominal wall.  The appendix was visualized and well prominent showed no evidence of hypervascularity or acute appendicitis.  In reverse Trendelenburg position and with the patient rolled to the left and 11 mm port was placed in the epigastrium and 2-5 mm Step ports were placed in the left lower quadrant.  The gallbladder was placed on cephalad traction.  It was a somewhat pale C foam green color instead of the typical Robbins a blue.  The gallbladder was in part intrahepatic especially in the area of the body towards Hartman's pouch.  The gallbladder was placed on cephalad traction in the neck of the gallbladder dissected  clear.  A Kumar clamp was placed and fluoroscopic cholangiograms completed making use of one half strength Conray 60.  This showed a long corkscrew cystic duct and free flow into the duodenum.  No evidence of retained common duct stones.  The cystic duct and cystic artery were divided between clips.  The gallbladder was removed from the liver bed making use of hook cautery dissection.  In one area small rent in the gallbladder was identified.  It had already been decompressed and there was scant bile spillage.  The gallbladder was placed in an Endo Catch bag and then delivered to the umbilical port site.  The right upper quadrant was irrigated with lactated Ringer solution.  Good hemostasis was noted.  All clips were intact.  The abdomen was then desufflated and ports removed under direct vision.  The fascia at the umbilicus was closed with a 0 Vicryl figure-of-eight suture.  The fascia in the epigastric site closed with a simple 0 Vicryl suture.  Skin incisions were closed with 4-0 Vicryl subcuticular sutures.  Benzoin and Steri-Strips followed by Telfa and Tegaderm dressings were applied.  Patient tolerated the procedure well and was taken to recovery room in stable condition.

## 2018-05-11 NOTE — Anesthesia Post-op Follow-up Note (Signed)
Anesthesia QCDR form completed.        

## 2018-05-11 NOTE — Transfer of Care (Signed)
Immediate Anesthesia Transfer of Care Note  Patient: Danielle Schwartz  Procedure(s) Performed: LAPAROSCOPIC CHOLECYSTECTOMY WITH INTRAOPERATIVE CHOLANGIOGRAM (N/A )  Patient Location: PACU  Anesthesia Type:General  Level of Consciousness: sedated and responds to stimulation  Airway & Oxygen Therapy: Patient Spontanous Breathing and Patient connected to nasal cannula oxygen  Post-op Assessment: Report given to RN and Post -op Vital signs reviewed and stable  Post vital signs: Reviewed and stable  Last Vitals:  Vitals Value Taken Time  BP 116/62 05/11/2018  8:51 AM  Temp 36.6 C 05/11/2018  8:51 AM  Pulse 60 05/11/2018  8:52 AM  Resp 18 05/11/2018  8:52 AM  SpO2 100 % 05/11/2018  8:52 AM  Vitals shown include unvalidated device data.  Last Pain:  Vitals:   05/11/18 0851  TempSrc:   PainSc: 0-No pain         Complications: No apparent anesthesia complications

## 2018-05-11 NOTE — H&P (Signed)
No change in clinical condition or exam. For cholecystectomy.  

## 2018-05-12 LAB — SURGICAL PATHOLOGY

## 2018-05-13 ENCOUNTER — Telehealth: Payer: Self-pay | Admitting: *Deleted

## 2018-05-13 NOTE — Anesthesia Postprocedure Evaluation (Signed)
Anesthesia Post Note  Patient: Danielle Schwartz  Procedure(s) Performed: LAPAROSCOPIC CHOLECYSTECTOMY WITH INTRAOPERATIVE CHOLANGIOGRAM (N/A )  Patient location during evaluation: PACU Anesthesia Type: General Level of consciousness: awake and alert Pain management: pain level controlled Vital Signs Assessment: post-procedure vital signs reviewed and stable Respiratory status: spontaneous breathing, nonlabored ventilation, respiratory function stable and patient connected to nasal cannula oxygen Cardiovascular status: blood pressure returned to baseline and stable Postop Assessment: no apparent nausea or vomiting Anesthetic complications: no     Last Vitals:  Vitals:   05/11/18 0938 05/11/18 1055  BP: 110/71 107/63  Pulse: (!) 59 63  Resp: 16 16  Temp: 36.6 C   SpO2: 96% 98%    Last Pain:  Vitals:   05/11/18 1055  TempSrc:   PainSc: 3                  Yevette EdwardsJames G Adams

## 2018-05-13 NOTE — Telephone Encounter (Signed)
Patient would like to RTW on 05-24-18.

## 2018-05-13 NOTE — Telephone Encounter (Signed)
Need return  To work date? FMLA papers will be faxed once completed with date.

## 2018-05-25 ENCOUNTER — Ambulatory Visit (INDEPENDENT_AMBULATORY_CARE_PROVIDER_SITE_OTHER): Payer: BLUE CROSS/BLUE SHIELD | Admitting: General Surgery

## 2018-05-25 ENCOUNTER — Encounter: Payer: Self-pay | Admitting: General Surgery

## 2018-05-25 ENCOUNTER — Other Ambulatory Visit: Payer: Self-pay

## 2018-05-25 VITALS — BP 134/81 | HR 66 | Temp 97.2°F | Resp 12 | Ht 70.0 in | Wt 152.0 lb

## 2018-05-25 DIAGNOSIS — K8020 Calculus of gallbladder without cholecystitis without obstruction: Secondary | ICD-10-CM

## 2018-05-25 DIAGNOSIS — R110 Nausea: Secondary | ICD-10-CM

## 2018-05-25 NOTE — Patient Instructions (Addendum)
The patient is aware to call back for any questions or new concerns.  Recommend Prilosec twice a day for 1 week to see if that improves the nausea, call with progress report after one week. If she sees improvement then drop down to once a day.

## 2018-05-25 NOTE — Progress Notes (Signed)
Patient ID: Danielle Schwartz, female   DOB: 09/24/1990, 27 y.o.   MRN: [id number]  Chief Complaint  Patient presents with  . Routine Post Op    HPI Danielle Schwartz is a 27 y.o. female.  Here today for postoperative visit, lap cholecystectomy on 05-11-18, she states she is doing well. She is on Augmentin for sinus congestion. She states she feels nauseated about one hour after she eats, lasting about an hour, but no vomiting. The family all developed GI virus symptoms after she had surgery. Bowels are moving about every 2-3 days, markedly decreased in frequency from before surgery when she was moving her bowels daily.  This is the second month since starting the birth control pills. She is here with her mom, Danielle Schwartz.  HPI  Past Medical History:  Diagnosis Date  . Abnormal Pap smear of cervix    ascus/pos  . Acne   . Anxiety   . Heart murmur     Past Surgical History:  Procedure Laterality Date  . CHOLECYSTECTOMY N/A 05/11/2018   Procedure: LAPAROSCOPIC CHOLECYSTECTOMY WITH INTRAOPERATIVE CHOLANGIOGRAM;  Surgeon: Earline MayotteByrnett, Eyoel Throgmorton W, MD;  Location: [facility];  Service: General;  Laterality: N/A;  . COLONOSCOPY  2018   Dr Mechele CollinElliott  . ETHMOIDECTOMY Bilateral 12/20/2015   Procedure: ENDOSCPIC ETHMOIDECTOMY TOTAL BILATERAL;  Surgeon: Vernie MurdersPaul Juengel, MD;  [facility];  Service: ENT;  Laterality: Bilateral;  . FRONTAL SINUS EXPLORATION Bilateral 12/20/2015   Procedure:  BILATERAL;  Surgeon: Vernie MurdersPaul Juengel, MD;  [facility];  Service: ENT;  Laterality: Bilateral;  . IMAGE GUIDED SINUS SURGERY N/A 12/20/2015   Procedure: IMAGE GUIDED SINUS SURGERY;  Surgeon: Vernie MurdersPaul Juengel, MD;  [facility];  Service: ENT;  Laterality: N/A;  GAVE DISK TO CE[facility] 6/28 DEE  . MAXILLARY ANTROSTOMY Bilateral 12/20/2015   Procedure: ENDOSCOPIC MAXILLARY ANTROSTOMY BILATERAL;  Surgeon: Vernie MurdersPaul Juengel, MD;  [facility];  Service: ENT;  Laterality: Bilateral;  .  SEPTOPLASTY N/A 12/20/2015   Procedure: SEPTOPLASTY;  Surgeon: Vernie MurdersPaul Juengel, MD;  [facility];  Service: ENT;  Laterality: N/A;  . UPPER GI ENDOSCOPY  2018   Dr Mechele CollinElliott  . WISDOM TOOTH EXTRACTION      Family History  Problem Relation Age of Onset  . Hyperlipidemia Mother   . Migraines Mother   . Migraines Sister   . Hyperlipidemia Brother   . Hypertension Brother   . Cancer Maternal Grandfather        colon cancer  . Diabetes Maternal Grandfather   . Cancer Paternal Grandmother        skin cancer  . Cancer Paternal Grandfather        bladder cancer/colon cancer    Social History Social History   Tobacco Use  . Smoking status: Former Smoker    Last attempt to quit: 07/20/2015    Years since quitting: 2.8  . Smokeless tobacco: Never Used  Substance Use Topics  . Alcohol use: Yes    Alcohol/week: 6.0 standard drinks    Types: 6 Glasses of wine per week    Comment: weekly  . Drug use: No    No Known Allergies  Current Outpatient Medications  Medication Sig Dispense Refill  . amoxicillin-clavulanate (AUGMENTIN) 875-125 MG tablet Take 1 tablet by mouth 2 (two) times daily.    . cetirizine (ZYRTEC) 10 MG tablet Take 10 mg by mouth daily as needed for allergies.     . nitrofurantoin, macrocrystal-monohydrate, (MACROBID) 100 MG capsule  Take 1 capsule (100 mg total) by mouth at bedtime as needed. After intercourse to prevent infection. 30 capsule 1  . norgestimate-ethinyl estradiol (SPRINTEC 28) 0.25-35 MG-MCG tablet Take 1 tablet by mouth daily.    Marland Kitchen spironolactone (ALDACTONE) 50 MG tablet Take 1 tablet (50 mg total) by mouth 2 (two) times daily. 60 tablet 6  . Triamcinolone Acetonide (NASACORT ALLERGY 24HR NA) Place 2 sprays into the nose daily.      No current facility-administered medications for this visit.     Review of Systems Review of Systems  Constitutional: Negative.   Respiratory: Negative.   Cardiovascular: Negative.   Gastrointestinal:  Positive for nausea.    Blood pressure 134/81, pulse 66, temperature (!) 97.2 F (36.2 C), temperature source Skin, resp. rate 12, height 5\' 10"  (1.778 m), weight 152 lb (68.9 kg), last menstrual period 05/12/2018, SpO2 98 %.  Physical Exam Physical Exam  Constitutional: She is oriented to person, place, and time. She appears well-developed and well-nourished.  HENT:  Mouth/Throat: Oropharynx is clear and moist.  Eyes: Conjunctivae are normal.  Neck: Neck supple.  Cardiovascular: Normal rate, regular rhythm and normal heart sounds.  Pulmonary/Chest: Effort normal and breath sounds normal.  Abdominal: Soft. Normal appearance.  Port sites clean.  Neurological: She is alert and oriented to person, place, and time.  Skin: Skin is warm and dry.  Psychiatric: Her behavior is normal.    Data Reviewed DATE: 05/06/2018 7:16 PM ACCESSION: [id number] UN DICTATED: 05/06/2018 7:17 PM INTERPRETATION LOCATION: [facility]  CLINICAL INDICATION: 27 years old Female with RUQ pain, vomiting    LIVER: The liver is normal in size and mildly echogenic. No focal hepatic lesions. No biliary ductal dilatation.   GALLBLADDER: The gallbladder is physiologically distended two nonmobile echogenic foci in the gallbladder neck measuring up to 0.9 cm. Sonographic Murphy sign is positive.   No pericholecystic fluid. No gallbladder wall thickening.  IMPRESSION: -Two nonmobile stones in the gallbladder neck measuring up to 0.9 cm with positive sonographic Murphy's sign. No gallbladder wall thickening. -No evidence of biliary ductal dilatation. -Echogenic liver which can be seen in hepatic steatosis or chronic liver disease.  ADDENDUM (05/07/2018 9:43 AM):   There is only one nonmobile small subcentimeter stone in the gallbladder. However, the gallbladder is not hydropic and the gallbladder wall is in normal thickness. No evidence of pericholecystic free fluid. Although the sonographic Murphy's sign is positive  per the technologist, the grayscale ultrasound findings of the gallbladder appear unremarkable except the nonmobile stone at the level of neck.  Pathology noted at the time of the May 11, 2018 cholecystectomy: DIAGNOSIS:  A. GALLBLADDER; CHOLECYSTECTOMY:  - MILD CHRONIC CHOLECYSTITIS.   Assessment    Resolution of right upper quadrant pain, postprandial nausea.  No clear dietary pack.    Plan     Recommend Prilosec twice a day for 1 week to see if that improves the nausea, call with progress report after one week. If she sees improvement then drop down to once a day.     HPI, Physical Exam, Assessment and Plan have been scribed under the direction and in the presence of Earline Mayotte, MD. Dorathy Daft, RN  I have completed the exam and reviewed the above documentation for accuracy and completeness.  I agree with the above.  Museum/gallery conservator has been used and any errors in dictation or transcription are unintentional.  Donnalee Curry, M.D., F.A.C.S.   Merrily Pew Hatem Cull 05/25/2018, 8:12 PM

## 2018-05-27 ENCOUNTER — Telehealth: Payer: Self-pay

## 2018-05-27 NOTE — Telephone Encounter (Signed)
Disability paperwork completed and placed in scan folder. Faxed to [employer] [phone]  Copy was made and placed in book.

## 2018-06-03 ENCOUNTER — Encounter: Payer: Self-pay | Admitting: General Surgery

## 2018-08-24 ENCOUNTER — Encounter: Payer: Self-pay | Admitting: General Surgery

## 2018-08-31 DIAGNOSIS — J301 Allergic rhinitis due to pollen: Secondary | ICD-10-CM | POA: Diagnosis not present

## 2018-08-31 DIAGNOSIS — J0181 Other acute recurrent sinusitis: Secondary | ICD-10-CM | POA: Diagnosis not present

## 2018-08-31 DIAGNOSIS — J328 Other chronic sinusitis: Secondary | ICD-10-CM | POA: Diagnosis not present

## 2018-09-20 DIAGNOSIS — L718 Other rosacea: Secondary | ICD-10-CM | POA: Diagnosis not present

## 2018-09-20 DIAGNOSIS — J309 Allergic rhinitis, unspecified: Secondary | ICD-10-CM | POA: Diagnosis not present

## 2018-09-20 DIAGNOSIS — L700 Acne vulgaris: Secondary | ICD-10-CM | POA: Diagnosis not present

## 2018-09-20 DIAGNOSIS — J019 Acute sinusitis, unspecified: Secondary | ICD-10-CM | POA: Diagnosis not present

## 2018-09-23 DIAGNOSIS — L718 Other rosacea: Secondary | ICD-10-CM | POA: Diagnosis not present

## 2018-09-24 DIAGNOSIS — J328 Other chronic sinusitis: Secondary | ICD-10-CM | POA: Diagnosis not present

## 2018-09-24 DIAGNOSIS — J309 Allergic rhinitis, unspecified: Secondary | ICD-10-CM | POA: Diagnosis not present

## 2018-11-22 ENCOUNTER — Other Ambulatory Visit: Payer: Self-pay

## 2018-11-22 MED ORDER — NORGESTIMATE-ETH ESTRADIOL 0.25-35 MG-MCG PO TABS: 1.0000 | ORAL_TABLET | Freq: Every day | ORAL | 0 refills | Status: DC

## 2019-02-10 ENCOUNTER — Other Ambulatory Visit: Payer: Self-pay

## 2019-02-11 ENCOUNTER — Other Ambulatory Visit: Payer: Self-pay

## 2019-02-11 ENCOUNTER — Encounter: Payer: Self-pay | Admitting: Certified Nurse Midwife

## 2019-02-11 MED ORDER — NORGESTIMATE-ETH ESTRADIOL 0.25-35 MG-MCG PO TABS: 1.0000 | ORAL_TABLET | Freq: Every day | ORAL | 0 refills | Status: DC

## 2019-03-01 ENCOUNTER — Ambulatory Visit (INDEPENDENT_AMBULATORY_CARE_PROVIDER_SITE_OTHER): Payer: BC Managed Care – PPO | Admitting: Certified Nurse Midwife

## 2019-03-01 ENCOUNTER — Encounter: Payer: Self-pay | Admitting: Certified Nurse Midwife

## 2019-03-01 ENCOUNTER — Other Ambulatory Visit: Payer: Self-pay

## 2019-03-01 ENCOUNTER — Other Ambulatory Visit (HOSPITAL_COMMUNITY)
Admission: RE | Admit: 2019-03-01 | Discharge: 2019-03-01 | Disposition: A | Discharge status: Home / Self Care | Payer: BC Managed Care – PPO | Source: Ambulatory Visit | Attending: Certified Nurse Midwife | Admitting: Certified Nurse Midwife

## 2019-03-01 VITALS — BP 136/75 | HR 58 | Ht 69.5 in | Wt 156.6 lb

## 2019-03-01 DIAGNOSIS — Z8742 Personal history of other diseases of the female genital tract: Secondary | ICD-10-CM | POA: Diagnosis not present

## 2019-03-01 DIAGNOSIS — N921 Excessive and frequent menstruation with irregular cycle: Secondary | ICD-10-CM

## 2019-03-01 DIAGNOSIS — Z01419 Encounter for gynecological examination (general) (routine) without abnormal findings: Secondary | ICD-10-CM

## 2019-03-01 DIAGNOSIS — Z124 Encounter for screening for malignant neoplasm of cervix: Secondary | ICD-10-CM | POA: Diagnosis not present

## 2019-03-01 MED ORDER — ETONOGESTREL-ETHINYL ESTRADIOL 0.12-0.015 MG/24HR VA RING: VAGINAL_RING | VAGINAL | 12 refills | Status: AC

## 2019-03-01 NOTE — Progress Notes (Signed)
Patient here for annual exam.  Patient will like to discuss switching OCP, c/o longer menstrual periods and PMS.

## 2019-03-01 NOTE — Patient Instructions (Addendum)
Preventive Care 21-26 Years Old, Female Preventive care refers to visits with your health care provider and lifestyle choices that can promote health and wellness. This includes:  A yearly physical exam. This may also be called an annual well check.  Regular dental visits and eye exams.  Immunizations.  Screening for certain conditions.  Healthy lifestyle choices, such as eating a healthy diet, getting regular exercise, not using drugs or products that contain nicotine and tobacco, and limiting alcohol use. What can I expect for my preventive care visit? Physical exam Your health care provider will check your:  Height and weight. This may be used to calculate body mass index (BMI), which tells if you are at a healthy weight.  Heart rate and blood pressure.  Skin for abnormal spots. Counseling Your health care provider may ask you questions about your:  Alcohol, tobacco, and drug use.  Emotional well-being.  Home and relationship well-being.  Sexual activity.  Eating habits.  Work and work environment.  Method of birth control.  Menstrual cycle.  Pregnancy history. What immunizations do I need?  Influenza (flu) vaccine  This is recommended every year. Tetanus, diphtheria, and pertussis (Tdap) vaccine  You may need a Td booster every 10 years. Varicella (chickenpox) vaccine  You may need this if you have not been vaccinated. Human papillomavirus (HPV) vaccine  If recommended by your health care provider, you may need three doses over 6 months. Measles, mumps, and rubella (MMR) vaccine  You may need at least one dose of MMR. You may also need a second dose. Meningococcal conjugate (MenACWY) vaccine  One dose is recommended if you are age 19-21 years and a first-year college student living in a residence hall, or if you have one of several medical conditions. You may also need additional booster doses. Pneumococcal conjugate (PCV13) vaccine  You may need  this if you have certain conditions and were not previously vaccinated. Pneumococcal polysaccharide (PPSV23) vaccine  You may need one or two doses if you smoke cigarettes or if you have certain conditions. Hepatitis A vaccine  You may need this if you have certain conditions or if you travel or work in places where you may be exposed to hepatitis A. Hepatitis B vaccine  You may need this if you have certain conditions or if you travel or work in places where you may be exposed to hepatitis B. Haemophilus influenzae type b (Hib) vaccine  You may need this if you have certain conditions. You may receive vaccines as individual doses or as more than one vaccine together in one shot (combination vaccines). Talk with your health care provider about the risks and benefits of combination vaccines. What tests do I need?  Blood tests  Lipid and cholesterol levels. These may be checked every 5 years starting at age 20.  Hepatitis C test.  Hepatitis B test. Screening  Diabetes screening. This is done by checking your blood sugar (glucose) after you have not eaten for a while (fasting).  Sexually transmitted disease (STD) testing.  BRCA-related cancer screening. This may be done if you have a family history of breast, ovarian, tubal, or peritoneal cancers.  Pelvic exam and Pap test. This may be done every 3 years starting at age 21. Starting at age 30, this may be done every 5 years if you have a Pap test in combination with an HPV test. Talk with your health care provider about your test results, treatment options, and if necessary, the need for more tests.   Follow these instructions at home: Eating and drinking   Eat a diet that includes fresh fruits and vegetables, whole grains, lean protein, and low-fat dairy.  Take vitamin and mineral supplements as recommended by your health care provider.  Do not drink alcohol if: ? Your health care provider tells you not to drink. ? You are  pregnant, may be pregnant, or are planning to become pregnant.  If you drink alcohol: ? Limit how much you have to 0-1 drink a day. ? Be aware of how much alcohol is in your drink. In the [place] one drink equals one 12 oz bottle of beer (355 mL), one 5 oz glass of wine (148 mL), or one 1 oz glass of hard liquor (44 mL). Lifestyle  Take daily care of your teeth and gums.  Stay active. Exercise for at least 30 minutes on 5 or more days each week.  Do not use any products that contain nicotine or tobacco, such as cigarettes, e-cigarettes, and chewing tobacco. If you need help quitting, ask your health care provider.  If you are sexually active, practice safe sex. Use a condom or other form of birth control (contraception) in order to prevent pregnancy and STIs (sexually transmitted infections). If you plan to become pregnant, see your health care provider for a preconception visit. What's next?  Visit your health care provider once a year for a well check visit.  Ask your health care provider how often you should have your eyes and teeth checked.  Stay up to date on all vaccines. This information is not intended to replace advice given to you by your health care provider. Make sure you discuss any questions you have with your health care provider. Document Released: 08/05/2001 Document Revised: 02/18/2018 Document Reviewed: 02/18/2018 Elsevier Patient Education  [place]. Ethinyl Estradiol; Etonogestrel vaginal ring What is this medicine? ETHINYL ESTRADIOL; ETONOGESTREL (ETH in il es tra DYE ole; et oh noe JES trel) vaginal ring is a flexible, vaginal ring used as a contraceptive (birth control method). This medicine combines 2 types of female hormones, an estrogen and a progestin. This ring is used to prevent ovulation and pregnancy. Each ring is effective for 1 month. This medicine may be used for other purposes; ask your health care provider or pharmacist if you have questions.  COMMON BRAND NAME(S): EluRyng, NuvaRing What should I tell my health care provider before I take this medicine? They need to know if you have any of these conditions:  abnormal vaginal bleeding  blood vessel disease or blood clots  breast, cervical, endometrial, ovarian, liver, or uterine cancer  diabetes  gallbladder disease  having surgery  heart disease or recent heart attack  high blood pressure  high cholesterol or triglycerides  history of irregular heartbeat or heart valve problems  kidney disease  liver disease  migraine headaches  protein C deficiency  protein S deficiency  recently had a baby, miscarriage, or abortion  stroke  systemic lupus erythematosus (SLE)  tobacco smoker  your age is more than 26 years old  an unusual or allergic reaction to estrogens, progestins, other medicines, foods, dyes, or preservatives  pregnant or trying to get pregnant  breast-feeding How should I use this medicine? Insert the ring into your vagina as directed. Follow the directions on the prescription label. The ring will remain place for 3 weeks and is then removed for a 1-week break. A new ring is inserted 1 week after the last ring was removed, on the  same day of the week. Check often to make sure the ring is still in place. If the ring was out of the vagina for an unknown amount of time, you may not be protected from pregnancy. Perform a pregnancy test and call your doctor. Do not use more often than directed. A patient package insert for the product will be given with each prescription and refill. Read this sheet carefully each time. The sheet may change frequently. Contact your pediatrician regarding the use of this medicine in children. Special care may be needed. Overdosage: If you think you have taken too much of this medicine contact a poison control center or emergency room at once. NOTE: This medicine is only for you. Do not share this medicine with others.  What if I miss a dose? You will need to use the ring exactly as directed. It is very important to follow the schedule every cycle. If you do not use the ring as directed, you may not be protected from pregnancy. If the ring should slip out, is lost, or if you leave it in longer or shorter than you should, contact your health care professional for advice. What may interact with this medicine? Do not take this medicine with the following medications:  dasabuvir; ombitasvir; paritaprevir; ritonavir  ombitasvir; paritaprevir; ritonavir  vaginal lubricants or other vaginal products that are oil-based or silicone-based This medicine may also interact with the following medications:  acetaminophen  antibiotics or medicines for infections, especially rifampin, rifabutin, rifapentine, and griseofulvin, and possibly penicillins or tetracyclines  aprepitant or fosaprepitant  armodafinil  ascorbic acid (vitamin C)  barbiturate medicines, such as phenobarbital or primidone  bosentan  certain antiviral medicines for hepatitis, HIV or AIDS  certain medicines for cancer treatment  certain medicines for seizures like carbamazepine, clobazam, felbamate, lamotrigine, oxcarbazepine, phenytoin, rufinamide, topiramate  certain medicines for treating high cholesterol  cyclosporine  dantrolene  elagolix  flibanserin  grapefruit juice  lesinurad  medicines for diabetes  medicines to treat fungal infections, such as griseofulvin, miconazole, fluconazole, ketoconazole, itraconazole, posaconazole or voriconazole  mifepristone  mitotane  modafinil  morphine  mycophenolate  St. John's wort  tamoxifen  temazepam  theophylline or aminophylline  thyroid hormones  tizanidine  tranexamic acid  ulipristal  warfarin This list may not describe all possible interactions. Give your health care provider a list of all the medicines, herbs, non-prescription drugs, or dietary  supplements you use. Also tell them if you smoke, drink alcohol, or use illegal drugs. Some items may interact with your medicine. What should I watch for while using this medicine? Visit your doctor or health care professional for regular checks on your progress. You will need a regular breast and pelvic exam and Pap smear while on this medicine. Check with your doctor or health care professional to see if you need an additional method of contraception during the first cycle that you use this ring. Female condoms (made with natural rubber latex, polyisoprene, and polyurethane) and spermicides may be used. Do not use a diaphragm, cervical cap, or a female condom, as the ring can interfere with these birth control methods and their proper placement. If you have any reason to think you are pregnant, stop using this medicine right away and contact your doctor or health care professional. If you are using this medicine for hormone related problems, it may take several cycles of use to see improvement in your condition. Smoking increases the risk of getting a blood clot or having a stroke  while you are using hormonal birth control, especially if you are more than 26 years old. You are strongly advised not to smoke. Some women are prone to getting dark patches on the skin of the face (cholasma). Your risk of getting chloasma with this medicine is higher if you had chloasma during a pregnancy. Keep out of the sun. If you cannot avoid being in the sun, wear protective clothing and use sunscreen. Do not use sun lamps or tanning beds/booths. This medicine can make your body retain fluid, making your fingers, hands, or ankles swell. Your blood pressure can go up. Contact your doctor or health care professional if you feel you are retaining fluid. If you are going to have elective surgery, you may need to stop using this medicine before the surgery. Consult your health care professional for advice. This medicine does  not protect you against HIV infection (AIDS) or any other sexually transmitted diseases. What side effects may I notice from receiving this medicine? Side effects that you should report to your doctor or health care professional as soon as possible:  allergic reactions such as skin rash or itching, hives, swelling of the lips, mouth, tongue, or throat  depression  high blood pressure  migraines or severe, sudden headaches  signs and symptoms of a blood clot such as breathing problems; changes in vision; chest pain; severe, sudden headache; pain, swelling, warmth in the leg; trouble speaking; sudden numbness or weakness of the face, arm or leg  signs and symptoms of infection like fever or chills with dizziness and a sunburn-like rash, or pain or trouble passing urine  stomach pain  symptoms of vaginal infection like itching, irritation or unusual discharge  yellowing of the eyes or skin Side effects that usually do not require medical attention (report these to your doctor or health care professional if they continue or are bothersome):  acne  breast pain, tenderness  irregular vaginal bleeding or spotting, particularly during the first month of use  mild headache  nausea  painful periods  vomiting This list may not describe all possible side effects. Call your doctor for medical advice about side effects. You may report side effects to FDA at 1-800-FDA-1088. Where should I keep my medicine? Keep out of the reach of children. Store unopened rings in the original foil pouch at room temperature between 20 and 25 degrees C (68 and 77 degrees F) for up to 4 months. Protect from light. Do not store above 30 degrees C (86 degrees F). Throw away any unused medicine after the expiration date. A ring may only be used for 1 cycle (1 month). After the 3-week cycle, a used ring is removed and should be placed in the re-closable foil pouch and discarded in the trash out of reach of children  and pets. Do NOT flush down the toilet. NOTE: This sheet is a summary. It may not cover all possible information. If you have questions about this medicine, talk to your doctor, pharmacist, or health care provider.  2020 Elsevier/Gold Standard (2017-02-06 14:41:10) Ethinyl Estradiol; Norgestimate tablets What is this medicine? ETHINYL ESTRADIOL; NORGESTIMATE (ETH in il es tra DYE ole; nor JES ti mate) is an oral contraceptive. The products combine two types of female hormones, an estrogen and a progestin. They are used to prevent ovulation and pregnancy. Some products are also used to treat acne in females. This medicine may be used for other purposes; ask your health care provider or pharmacist if you have questions. COMMON  BRAND NAME(S): Estarylla, Mili, MONO-LINYAH, MonoNessa, Norgestimate/Ethinyl Estradiol, Ortho Tri-Cyclen, Ortho Tri-Cyclen Lo, Ortho-Cyclen, Previfem, Sprintec, Tri-Estarylla, TRI-LINYAH, Tri-Lo-Estarylla, Tri-Lo-Marzia, Tri-Lo-Mili, Tri-Lo-Sprintec, Tri-Mili, Tri-Previfem, Tri-Sprintec, Tri-VyLibra, Trinessa, Trinessa Lo, VyLibra What should I tell my health care provider before I take this medicine? They need to know if you have or ever had any of these conditions:  abnormal vaginal bleeding  blood vessel disease or blood clots  breast, cervical, endometrial, ovarian, liver, or uterine cancer  diabetes  gallbladder disease  heart disease or recent heart attack  high blood pressure  high cholesterol  kidney disease  liver disease  migraine headaches  stroke  systemic lupus erythematosus (SLE)  tobacco smoker  an unusual or allergic reaction to estrogens, progestins, other medicines, foods, dyes, or preservatives  pregnant or trying to get pregnant  breast-feeding How should I use this medicine? Take this medicine by mouth. To reduce nausea, this medicine may be taken with food. Follow the directions on the prescription label. Take this medicine at  the same time each day and in the order directed on the package. Do not take your medicine more often than directed. Contact your pediatrician regarding the use of this medicine in children. Special care may be needed. This medicine has been used in female children who have started having menstrual periods. A patient package insert for the product will be given with each prescription and refill. Read this sheet carefully each time. The sheet may change frequently. Overdosage: If you think you have taken too much of this medicine contact a poison control center or emergency room at once. NOTE: This medicine is only for you. Do not share this medicine with others. What if I miss a dose? If you miss a dose, refer to the patient information sheet you received with your medicine for direction. If you miss more than one pill, this medicine may not be as effective and you may need to use another form of birth control. What may interact with this medicine? Do not take this medicine with the following medication:  dasabuvir; ombitasvir; paritaprevir; ritonavir  ombitasvir; paritaprevir; ritonavir This medicine may also interact with the following medications:  acetaminophen  antibiotics or medicines for infections, especially rifampin, rifabutin, rifapentine, and griseofulvin, and possibly penicillins or tetracyclines  aprepitant  ascorbic acid (vitamin C)  atorvastatin  barbiturate medicines, such as phenobarbital  bosentan  carbamazepine  caffeine  clofibrate  cyclosporine  dantrolene  doxercalciferol  felbamate  grapefruit juice  hydrocortisone  medicines for anxiety or sleeping problems, such as diazepam or temazepam  medicines for diabetes, including pioglitazone  mineral oil  modafinil  mycophenolate  nefazodone  oxcarbazepine  phenytoin  prednisolone  ritonavir or other medicines for HIV infection or AIDS  rosuvastatin  selegiline  soy isoflavones  supplements  St. John's wort  tamoxifen or raloxifene  theophylline  thyroid hormones  topiramate  warfarin This list may not describe all possible interactions. Give your health care provider a list of all the medicines, herbs, non-prescription drugs, or dietary supplements you use. Also tell them if you smoke, drink alcohol, or use illegal drugs. Some items may interact with your medicine. What should I watch for while using this medicine? Visit your doctor or health care professional for regular checks on your progress. You will need a regular breast and pelvic exam and Pap smear while on this medicine. You should also discuss the need for regular mammograms with your health care professional, and follow his or her guidelines for these tests. This  medicine can make your body retain fluid, making your fingers, hands, or ankles swell. Your blood pressure can go up. Contact your doctor or health care professional if you feel you are retaining fluid. Use an additional method of contraception during the first cycle that you take these tablets. If you have any reason to think you are pregnant, stop taking this medicine right away and contact your doctor or health care professional. If you are taking this medicine for hormone related problems, it may take several cycles of use to see improvement in your condition. Do not use this product if you smoke and are over 21 years of age. Smoking increases the risk of getting a blood clot or having a stroke while you are taking birth control pills, especially if you are more than 26 years old. If you are a smoker who is 70 years of age or younger, you are strongly advised not to smoke while taking birth control pills. This medicine can make you more sensitive to the sun. Keep out of the sun. If you cannot avoid being in the sun, wear protective clothing and use sunscreen. Do not use sun lamps or tanning beds/booths. If you wear contact lenses and notice  visual changes, or if the lenses begin to feel uncomfortable, consult your eye care specialist. In some women, tenderness, swelling, or minor bleeding of the gums may occur. Notify your dentist if this happens. Brushing and flossing your teeth regularly may help limit this. See your dentist regularly and inform your dentist of the medicines you are taking. If you are going to have elective surgery, you may need to stop taking this medicine before the surgery. Consult your health care professional for advice. This medicine does not protect you against HIV infection (AIDS) or any other sexually transmitted diseases. What side effects may I notice from receiving this medicine? Side effects that you should report to your doctor or health care professional as soon as possible:  breast tissue changes or discharge  changes in vaginal bleeding during your period or between your periods  chest pain  coughing up blood  dizziness or fainting spells  headaches or migraines  leg, arm or groin pain  severe or sudden headaches  stomach pain (severe)  sudden shortness of breath  sudden loss of coordination, especially on one side of the body  speech problems  symptoms of vaginal infection like itching, irritation or unusual discharge  tenderness in the upper abdomen  vomiting  weakness or numbness in the arms or legs, especially on one side of the body  yellowing of the eyes or skin Side effects that usually do not require medical attention (report to your doctor or health care professional if they continue or are bothersome):  breakthrough bleeding and spotting that continues beyond the 3 initial cycles of pills  breast tenderness  mood changes, anxiety, depression, frustration, anger, or emotional outbursts  increased sensitivity to sun or ultraviolet light  nausea  skin rash, acne, or brown spots on the skin  weight gain (slight) This list may not describe all possible side  effects. Call your doctor for medical advice about side effects. You may report side effects to FDA at 1-800-FDA-1088. Where should I keep my medicine? Keep out of the reach of children. Store at room temperature between 15 and 30 degrees C (59 and 86 degrees F). Throw away any unused medicine after the expiration date. NOTE: This sheet is a summary. It may not cover all possible information.  If you have questions about this medicine, talk to your doctor, pharmacist, or health care provider.  2020 Elsevier/Gold Standard (2016-02-18 08:09:09)

## 2019-03-01 NOTE — Progress Notes (Addendum)
ANNUAL PREVENTATIVE CARE GYN  ENCOUNTER NOTE  Subjective:       Danielle Schwartz is a 28 y.o. G0P0000 female here for a routine annual gynecologic exam.  Current complaints: 1. Breakthrough bleeding on OCPs-questions other options 2. Needs Pap smear-history of abnormal  Denies difficulty breathing or respiratory distress, chest pain, abdominal pain, excessive vaginal bleeding, dysuria, and leg pain or swelling.    Gynecologic History  Patient's last menstrual period was 02/14/2019 (exact date). Period Cycle (Days): 28 Period Duration (Days): 10 Period Pattern: Regular Menstrual Flow: Heavy Menstrual Control: Tampon, Thin pad Dysmenorrhea: (!) Mild Dysmenorrhea Symptoms: Cramping,  Contraception: OCP (estrogen/progesterone)  Last Pap: 09/2017. Results were: normal   Obstetric History  OB History  Gravida Para Term Preterm AB Living  0 0 0 0 0 0  SAB TAB Ectopic Multiple Live Births  0 0 0 0 0    Past Medical History:  Diagnosis Date  . Abnormal Pap smear of cervix    ascus/pos  . Acne   . Anxiety   . Heart murmur     Past Surgical History:  Procedure Laterality Date  . CHOLECYSTECTOMY N/A 05/11/2018   Procedure: LAPAROSCOPIC CHOLECYSTECTOMY WITH INTRAOPERATIVE CHOLANGIOGRAM;  Surgeon: Robert Bellow, MD;  Location: [facility];  Service: General;  Laterality: N/A;  . COLONOSCOPY  2018   Dr Vira Agar  . ETHMOIDECTOMY Bilateral 12/20/2015   Procedure: ENDOSCPIC ETHMOIDECTOMY TOTAL BILATERAL;  Surgeon: Margaretha Sheffield, MD;  Location: [place];  Service: ENT;  Laterality: Bilateral;  . FRONTAL SINUS EXPLORATION Bilateral 12/20/2015   Procedure:  BILATERAL;  Surgeon: Margaretha Sheffield, MD;  Location: [place];  Service: ENT;  Laterality: Bilateral;  . IMAGE GUIDED SINUS SURGERY N/A 12/20/2015   Procedure: IMAGE GUIDED SINUS SURGERY;  Surgeon: Margaretha Sheffield, MD;  Location: [place];  Service: ENT;  Laterality: N/A;  GAVE DISK TO CECE 6/28 DEE  .  MAXILLARY ANTROSTOMY Bilateral 12/20/2015   Procedure: ENDOSCOPIC MAXILLARY ANTROSTOMY BILATERAL;  Surgeon: Margaretha Sheffield, MD;  Location: [place];  Service: ENT;  Laterality: Bilateral;  . SEPTOPLASTY N/A 12/20/2015   Procedure: SEPTOPLASTY;  Surgeon: Margaretha Sheffield, MD;  Location: [place];  Service: ENT;  Laterality: N/A;  . UPPER GI ENDOSCOPY  2018   Dr Vira Agar  . WISDOM TOOTH EXTRACTION      Current Outpatient Medications on File Prior to Visit  Medication Sig Dispense Refill  . Diindolylmethane POWD Take 1 capsule by mouth daily.    . Multiple Vitamins-Minerals (MULTIVITAMIN GUMMIES ADULT PO) Take 4 Units by mouth daily.    . nitrofurantoin, macrocrystal-monohydrate, (MACROBID) 100 MG capsule Take 1 capsule (100 mg total) by mouth at bedtime as needed. After intercourse to prevent infection. 30 capsule 1  . norgestimate-ethinyl estradiol (SPRINTEC 28) 0.25-35 MG-MCG tablet Take 1 tablet by mouth daily. 1 Package 0  . Probiotic Product (PROBIOTIC-10 PO) Take 1 capsule by mouth daily.     No current facility-administered medications on file prior to visit.     No Known Allergies  Social History   Socioeconomic History  . Marital status: Single    Spouse name: Not on file  . Number of children: Not on file  . Years of education: Not on file  . Highest education level: Not on file  Occupational History  . Not on file  Social Needs  . Financial resource strain: Not on file  . Food insecurity    Worry: Not on file    Inability: Not  on file  . Transportation needs    Medical: Not on file    Non-medical: Not on file  Tobacco Use  . Smoking status: Former Smoker    Quit date: 07/20/2015    Years since quitting: 3.6  . Smokeless tobacco: Never Used  Substance and Sexual Activity  . Alcohol use: Yes    Alcohol/week: 6.0 standard drinks    Types: 6 Glasses of wine per week    Comment: weekly  . Drug use: No  . Sexual activity: Yes    Partners: Male     Birth control/protection: Condom, Pill  Lifestyle  . Physical activity    Days per week: Not on file    Minutes per session: Not on file  . Stress: Not on file  Relationships  . Social Musicianconnections    Talks on phone: Not on file    Gets together: Not on file    Attends religious service: Not on file    Active member of club or organization: Not on file    Attends meetings of clubs or organizations: Not on file    Relationship status: Not on file  . Intimate partner violence    Fear of current or ex partner: Not on file    Emotionally abused: Not on file    Physically abused: Not on file    Forced sexual activity: Not on file  Other Topics Concern  . Not on file  Social History Narrative  . Not on file    Family History  Problem Relation Age of Onset  . Hyperlipidemia Mother   . Migraines Mother   . Migraines Sister   . Hyperlipidemia Brother   . Hypertension Brother   . Cancer Maternal Grandfather        colon cancer  . Diabetes Maternal Grandfather   . Colon cancer Maternal Grandfather   . Cancer Paternal Grandmother        skin cancer  . Cancer Paternal Grandfather        bladder cancer/colon cancer  . Colon cancer Paternal Grandfather   . Breast cancer Neg Hx   . Ovarian cancer Neg Hx     The following portions of the patient's history were reviewed and updated as appropriate: allergies, current medications, past family history, past medical history, past social history, past surgical history and problem list.  Review of Systems  ROS negative except as noted above. Information obtained from patient.    Objective:   BP 136/75   Pulse (!) 58   Ht 5' 9.5" (1.765 m)   Wt 156 lb 9.6 oz (71 kg)   LMP 02/14/2019 (Exact Date)   BMI 22.79 kg/m    CONSTITUTIONAL: Well-developed, well-nourished female in no acute distress.   PSYCHIATRIC: Normal mood and affect. Normal behavior. Normal judgment and thought content.  NEUROLGIC: Alert and oriented to person,  place, and time. Normal muscle tone coordination. No cranial nerve deficit noted.  HENT:  Normocephalic, atraumatic, External right and left ear normal.   EYES: Conjunctivae and EOM are normal. Pupils are equal and round.   NECK: Normal range of motion, supple, no masses.  Normal thyroid.   SKIN: Skin is warm and dry. No rash noted. Not diaphoretic. No erythema. No pallor.  CARDIOVASCULAR: Normal heart rate noted, regular rhythm, no murmur.  RESPIRATORY: Clear to auscultation bilaterally. Effort and breath sounds normal, no problems with respiration noted.  BREASTS: Symmetric in size. No masses, skin changes, nipple drainage, or lymphadenopathy.  ABDOMEN: Soft,  normal bowel sounds, no distention noted.  No tenderness, rebound or guarding.   PELVIC:  External Genitalia: Normal  Vagina: Normal  Cervix: Normal, Pap collected  Uterus: Normal  Adnexa: Normal   MUSCULOSKELETAL: Normal range of motion. No tenderness.  No cyanosis, clubbing, or edema.  2+ distal pulses.  LYMPHATIC: No Axillary, Supraclavicular, or Inguinal Adenopathy.  Assessment:   Annual gynecologic examination 28 y.o.   Contraception: NuvaRing vaginal inserts   Normal BMI   Problem List Items Addressed This Visit    None    Visit Diagnoses    Well woman exam    -  Primary   Relevant Orders   Cytology - PAP   History of abnormal cervical Pap smear       Relevant Orders   Cytology - PAP   Screening for cervical cancer       Breakthrough bleeding on OCPs          Plan:   Pap: Pap, Reflex if ASCUS  Labs: Declined   Rx: Nuvaring, see orders  Routine preventative health maintenance measures emphasized: Exercise/Diet/Weight control, Tobacco Warnings, Alcohol/Substance use risks, Stress Management, Peer Pressure Issues and Safe Sex; see AVS  Reviewed red flag symptoms and when to call  RTC x 1 year for ANNUAL EXAM or sooner if needed.    Gunnar Bulla, CNM [facility],  [facility] 03/01/19 6:16 PM

## 2019-03-04 ENCOUNTER — Other Ambulatory Visit: Payer: Self-pay | Admitting: Certified Nurse Midwife

## 2019-03-04 LAB — CYTOLOGY - PAP
Adequacy: ABSENT
Diagnosis: NEGATIVE

## 2019-04-15 ENCOUNTER — Encounter: Payer: Self-pay | Admitting: Certified Nurse Midwife

## 2019-04-18 ENCOUNTER — Encounter: Payer: BC Managed Care – PPO | Admitting: Certified Nurse Midwife

## 2019-04-25 ENCOUNTER — Encounter: Payer: BC Managed Care – PPO | Admitting: Certified Nurse Midwife

## 2019-05-12 ENCOUNTER — Encounter: Payer: Self-pay | Admitting: Certified Nurse Midwife

## 2019-05-12 NOTE — Telephone Encounter (Signed)
OCPs that work for acne: Clinical biochemist, [place], Curator. Thanks, JML

## 2019-05-13 ENCOUNTER — Other Ambulatory Visit: Payer: Self-pay

## 2019-05-13 MED ORDER — NORGESTIM-ETH ESTRAD TRIPHASIC 0.18/0.215/0.25 MG-35 MCG PO TABS: 1.0000 | ORAL_TABLET | Freq: Every day | ORAL | 3 refills | Status: AC

## 2020-03-01 ENCOUNTER — Encounter: Payer: BC Managed Care – PPO | Admitting: Certified Nurse Midwife

## 2020-03-07 ENCOUNTER — Encounter: Payer: Self-pay | Admitting: Certified Nurse Midwife
